# Patient Record
Sex: Male | Born: 1945
Health system: Southern US, Community
[De-identification: ages and names within clinical notes are randomized; demographics above are authoritative.]

## PROBLEM LIST (undated history)

## (undated) DIAGNOSIS — G4700 Insomnia, unspecified: Secondary | ICD-10-CM

## (undated) DIAGNOSIS — I700 Atherosclerosis of aorta: Secondary | ICD-10-CM

## (undated) DIAGNOSIS — I5189 Other ill-defined heart diseases: Secondary | ICD-10-CM

## (undated) DIAGNOSIS — I10 Essential (primary) hypertension: Secondary | ICD-10-CM

## (undated) DIAGNOSIS — R9431 Abnormal electrocardiogram [ECG] [EKG]: Secondary | ICD-10-CM

## (undated) DIAGNOSIS — K219 Gastro-esophageal reflux disease without esophagitis: Secondary | ICD-10-CM

## (undated) DIAGNOSIS — Z8719 Personal history of other diseases of the digestive system: Secondary | ICD-10-CM

## (undated) DIAGNOSIS — R931 Abnormal findings on diagnostic imaging of heart and coronary circulation: Secondary | ICD-10-CM

## (undated) DIAGNOSIS — M459 Ankylosing spondylitis of unspecified sites in spine: Secondary | ICD-10-CM

## (undated) DIAGNOSIS — M109 Gout, unspecified: Secondary | ICD-10-CM

## (undated) DIAGNOSIS — E7800 Pure hypercholesterolemia, unspecified: Secondary | ICD-10-CM

## (undated) DIAGNOSIS — I319 Disease of pericardium, unspecified: Secondary | ICD-10-CM

## (undated) DIAGNOSIS — Z8589 Personal history of malignant neoplasm of other organs and systems: Secondary | ICD-10-CM

## (undated) HISTORY — DX: Personal history of malignant neoplasm of other organs and systems: Z85.89

## (undated) HISTORY — DX: Atherosclerosis of aorta: I70.0

## (undated) HISTORY — DX: Insomnia, unspecified: G47.00

## (undated) HISTORY — DX: Other ill-defined heart diseases: I51.89

## (undated) HISTORY — DX: Ankylosing spondylitis of unspecified sites in spine: M45.9

## (undated) HISTORY — DX: Disease of pericardium, unspecified: I31.9

## (undated) HISTORY — DX: Personal history of other diseases of the digestive system: Z87.19

## (undated) HISTORY — DX: Pure hypercholesterolemia, unspecified: E78.00

## (undated) HISTORY — DX: Abnormal findings on diagnostic imaging of heart and coronary circulation: R93.1

## (undated) HISTORY — DX: Essential (primary) hypertension: I10

## (undated) HISTORY — DX: Gout, unspecified: M10.9

## (undated) HISTORY — DX: Gastro-esophageal reflux disease without esophagitis: K21.9

## (undated) HISTORY — DX: Abnormal electrocardiogram (ECG) (EKG): R94.31

---

## 1998-11-04 ENCOUNTER — Encounter: Payer: Self-pay | Admitting: Family Medicine

## 1998-11-04 ENCOUNTER — Ambulatory Visit (HOSPITAL_COMMUNITY): Admission: RE | Admit: 1998-11-04 | Discharge: 1998-11-04 | Payer: Self-pay | Admitting: Family Medicine

## 2000-04-15 ENCOUNTER — Encounter: Payer: Self-pay | Admitting: Family Medicine

## 2000-04-15 ENCOUNTER — Ambulatory Visit (HOSPITAL_COMMUNITY): Admission: RE | Admit: 2000-04-15 | Discharge: 2000-04-15 | Payer: Self-pay | Admitting: Family Medicine

## 2001-04-10 ENCOUNTER — Emergency Department (HOSPITAL_COMMUNITY): Admission: EM | Admit: 2001-04-10 | Discharge: 2001-04-10 | Payer: Self-pay

## 2001-09-12 ENCOUNTER — Encounter: Payer: Self-pay | Admitting: Family Medicine

## 2001-09-12 ENCOUNTER — Ambulatory Visit (HOSPITAL_COMMUNITY): Admission: RE | Admit: 2001-09-12 | Discharge: 2001-09-12 | Payer: Self-pay | Admitting: Family Medicine

## 2013-09-29 ENCOUNTER — Encounter: Payer: Self-pay | Admitting: General Surgery

## 2013-09-29 DIAGNOSIS — I10 Essential (primary) hypertension: Secondary | ICD-10-CM | POA: Insufficient documentation

## 2013-09-29 DIAGNOSIS — Z8679 Personal history of other diseases of the circulatory system: Secondary | ICD-10-CM | POA: Insufficient documentation

## 2013-09-29 DIAGNOSIS — I309 Acute pericarditis, unspecified: Secondary | ICD-10-CM

## 2013-10-02 ENCOUNTER — Ambulatory Visit (INDEPENDENT_AMBULATORY_CARE_PROVIDER_SITE_OTHER): Payer: BC Managed Care – PPO | Admitting: Cardiology

## 2013-10-02 ENCOUNTER — Encounter: Payer: Self-pay | Admitting: Cardiology

## 2013-10-02 ENCOUNTER — Encounter (INDEPENDENT_AMBULATORY_CARE_PROVIDER_SITE_OTHER): Payer: Self-pay

## 2013-10-02 VITALS — BP 140/89 | HR 79 | Ht 69.5 in | Wt 204.0 lb

## 2013-10-02 DIAGNOSIS — I10 Essential (primary) hypertension: Secondary | ICD-10-CM

## 2013-10-02 DIAGNOSIS — I309 Acute pericarditis, unspecified: Secondary | ICD-10-CM

## 2013-10-02 NOTE — Progress Notes (Signed)
  [address] [place], [place]  [address] Phone: [phone] Fax:  [phone]  Date:  10/02/2013   ID:  Jacob Mcclain, DOB 09/07/1946, MRN [id number]  PCP:  Sissy Hoff, MD  Cardiologist:  Armanda Magic, MD     History of Present Illness: Jacob Mcclain is a 67 y.o. male with a history of Pericarditis and HTN who presents today for followup.  He denies any chest pain, SOB, DOE, LE edema, dizziness, palpitations or syncope.   Wt Readings from Last 3 Encounters:  10/02/13 204 lb (92.534 kg)  09/29/13 193 lb 9.6 oz (87.816 kg)     Past Medical History  Diagnosis Date  . Ankylosing spondylitis   . Hypertension   . Hypercholesterolemia   . GERD (gastroesophageal reflux disease)   . Insomnia   . History of squamous cell carcinoma     Followed by Dr Terri Piedra  . History of hemorrhoids   . Gout     Last flare 7/09  . Pericarditis     Current Outpatient Prescriptions  Medication Sig Dispense Refill  . amLODipine (NORVASC) 5 MG tablet Take 5 mg by mouth daily.      Marland Kitchen aspirin 81 MG tablet Take 81 mg by mouth daily.      Marland Kitchen atorvastatin (LIPITOR) 20 MG tablet Take 20 mg by mouth daily.      . multivitamin (ONE-A-DAY MEN'S) TABS tablet Take 1 tablet by mouth daily.      . nebivolol (BYSTOLIC) 5 MG tablet Take 5 mg by mouth daily.      . Omega-3 Fatty Acids (FISH OIL) 1000 MG CAPS Take by mouth.      Marland Kitchen omeprazole (PRILOSEC) 20 MG capsule Take 20 mg by mouth daily.       No current facility-administered medications for this visit.    Allergies:   No Known Allergies  Social History:  The patient  reports that he has quit smoking. His smoking use included Cigarettes. He smoked 0.00 packs per day. He has never used smokeless tobacco. He reports that he drinks alcohol. He reports that he does not use illicit drugs.   Family History:  The patient's family history is not on file.   ROS:  Please see the history of present illness.      All other systems reviewed and  negative.   PHYSICAL EXAM: VS:  BP 140/89  Pulse 79  Ht 5' 9.5" (1.765 m)  Wt 204 lb (92.534 kg)  BMI 29.70 kg/m2 Well nourished, well developed, in no acute distress HEENT: normal Neck: no JVD Cardiac:  normal S1, S2; RRR; no murmur Lungs:  clear to auscultation bilaterally, no wheezing, rhonchi or rales Abd: soft, nontender, no hepatomegaly Ext: no edema Skin: warm and dry Neuro:  CNs 2-12 intact, no focal abnormalities noted  EKG:  NSR with no ST changes     ASSESSMENT AND PLAN:  1.  History of pericarditis with no reoccurence 2.  HTN with mildly elevated BP on exam but he was rushing to get here and his BP at home shows 130/29mmHg    - continue amlodipine/bystolic  Followup with me in 1 year  Signed, Armanda Magic, MD 10/02/2013 9:57 AM

## 2013-10-02 NOTE — Patient Instructions (Signed)
Your physician recommends that you continue on your current medications as directed. Please refer to the Current Medication list given to you today.  Your physician wants you to follow-up in: 1 Year with Dr Turner You will receive a reminder letter in the mail two months in advance. If you don't receive a letter, please call our office to schedule the follow-up appointment.  

## 2014-08-02 ENCOUNTER — Encounter: Payer: Self-pay | Admitting: Cardiology

## 2014-10-04 ENCOUNTER — Encounter: Payer: Self-pay | Admitting: Cardiology

## 2014-10-04 ENCOUNTER — Ambulatory Visit (INDEPENDENT_AMBULATORY_CARE_PROVIDER_SITE_OTHER): Payer: BC Managed Care – PPO | Admitting: Cardiology

## 2014-10-04 VITALS — BP 148/90 | HR 92 | Ht 71.0 in | Wt 204.4 lb

## 2014-10-04 DIAGNOSIS — I10 Essential (primary) hypertension: Secondary | ICD-10-CM

## 2014-10-04 DIAGNOSIS — Z8679 Personal history of other diseases of the circulatory system: Secondary | ICD-10-CM

## 2014-10-04 NOTE — Progress Notes (Signed)
  [address], [place], [place]  [address] Phone: [phone] Fax:  [phone]  Date:  10/04/2014   ID:  Jacob Mcclain, DOB 03-Dec-1945, MRN [id number]  PCP:  Gara Kroner, MD  Cardiologist:  Fransico Him, MD    History of Present Illness: Jacob Mcclain is a 68 y.o. male with a history of Pericarditis and HTN who presents today for followup. He denies any chest pain, SOB, DOE, LE edema, dizziness, palpitations or syncope.  He works outside for exercise.     Wt Readings from Last 3 Encounters:  10/04/14 204 lb 6.4 oz (92.715 kg)  10/02/13 204 lb (92.534 kg)  09/29/13 193 lb 9.6 oz (87.816 kg)     Past Medical History  Diagnosis Date  . Ankylosing spondylitis   . Hypertension   . Hypercholesterolemia   . GERD (gastroesophageal reflux disease)   . Insomnia   . History of squamous cell carcinoma     Followed by Dr Allyson Sabal  . History of hemorrhoids   . Gout     Last flare 7/09  . Pericarditis   . Diastolic dysfunction     Current Outpatient Prescriptions  Medication Sig Dispense Refill  . amLODipine (NORVASC) 5 MG tablet Take 5 mg by mouth daily.    Marland Kitchen aspirin 81 MG tablet Take 81 mg by mouth daily.    Marland Kitchen atorvastatin (LIPITOR) 20 MG tablet Take 20 mg by mouth daily.    . multivitamin (ONE-A-DAY MEN'S) TABS tablet Take 1 tablet by mouth daily.    . nebivolol (BYSTOLIC) 5 MG tablet Take 5 mg by mouth daily.    . Omega-3 Fatty Acids (FISH OIL) 1000 MG CAPS Take by mouth.     No current facility-administered medications for this visit.    Allergies:   No Known Allergies  Social History:  The patient  reports that he has quit smoking. His smoking use included Cigarettes. He smoked 0.00 packs per day. He has never used smokeless tobacco. He reports that he drinks about 4.2 oz of alcohol per week. He reports that he does not use illicit drugs.   Family History:  The patient's family history includes Lung cancer in his father.   ROS:  Please see the history  of present illness.      All other systems reviewed and negative.   PHYSICAL EXAM: VS:  BP 148/90 mmHg  Pulse 92  Ht 5\' 11"  (1.803 m)  Wt 204 lb 6.4 oz (92.715 kg)  BMI 28.52 kg/m2 Well nourished, well developed, in no acute distress HEENT: normal Neck: no JVD Cardiac:  normal S1, S2; RRR; no murmur Lungs:  clear to auscultation bilaterally, no wheezing, rhonchi or rales Abd: soft, nontender, no hepatomegaly Ext: trace edema Skin: warm and dry Neuro:  CNs 2-12 intact, no focal abnormalities noted  EKG:     NSR with no ST changes  ASSESSMENT AND PLAN:  1. History of pericarditis with no reoccurence 2. HTN with mildly elevated BP on exam but he was rushing to get here from his car and his BP at home shows 130/101mmHg  - continue amlodipine/bystolic  Followup with me in 1 year  Signed, Fransico Him, MD [facility] 10/04/2014 8:44 AM

## 2014-10-04 NOTE — Patient Instructions (Signed)
Your physician recommends that you continue on your current medications as directed. Please refer to the Current Medication list given to you today.  Your physician wants you to follow-up in: 1 year with Dr. Turner. You will receive a reminder letter in the mail two months in advance. If you don't receive a letter, please call our office to schedule the follow-up appointment.  

## 2014-10-12 ENCOUNTER — Other Ambulatory Visit: Payer: Self-pay | Admitting: Family Medicine

## 2014-10-12 DIAGNOSIS — Z87891 Personal history of nicotine dependence: Secondary | ICD-10-CM

## 2014-11-12 ENCOUNTER — Ambulatory Visit
Admission: RE | Admit: 2014-11-12 | Discharge: 2014-11-12 | Disposition: A | Discharge status: Home / Self Care | Payer: BC Managed Care – PPO | Source: Ambulatory Visit | Attending: Family Medicine | Admitting: Family Medicine

## 2014-11-12 DIAGNOSIS — Z87891 Personal history of nicotine dependence: Secondary | ICD-10-CM

## 2015-07-10 ENCOUNTER — Encounter: Payer: Self-pay | Admitting: Cardiology

## 2015-09-09 IMAGING — US US AORTA
1 series · 12 of 12 positions shown · non-contrast
Comparison: None.

CLINICAL DATA: Aneurysm screening, hypertension, previous tobacco
abuse

EXAM:
ULTRASOUND OF ABDOMINAL AORTA
TECHNIQUE: Ultrasound examination of the abdominal aorta was performed to
evaluate for abdominal aortic aneurysm.

[Series 1: us aorta · 0.29mm/px · 12 of 12 slices shown]
[im 1/12]
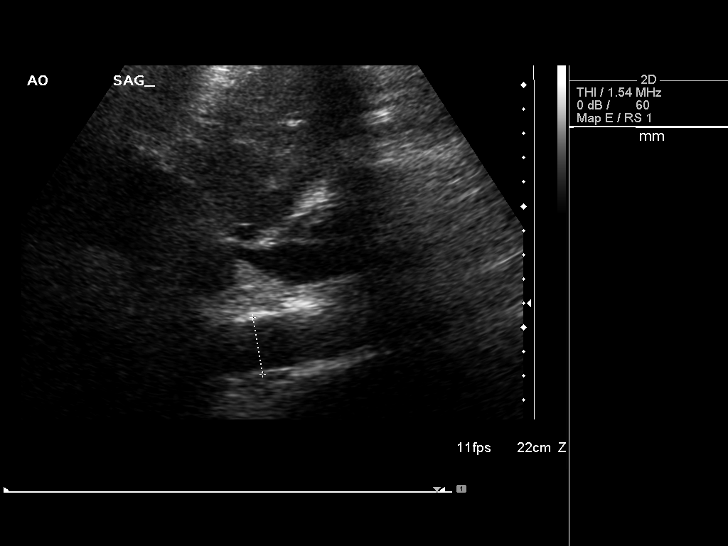
[im 2/12]
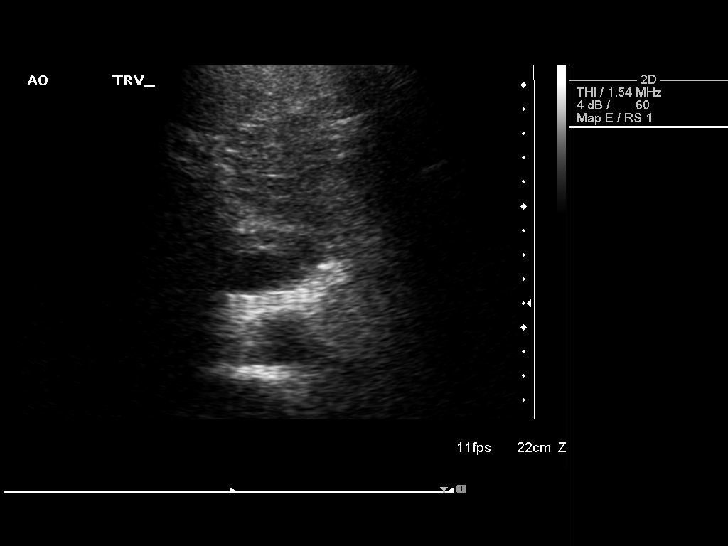
[im 3/12]
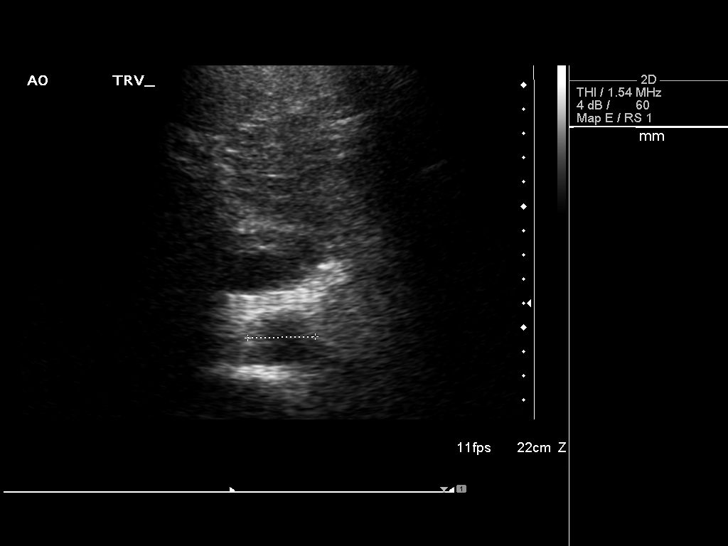
[im 4/12]
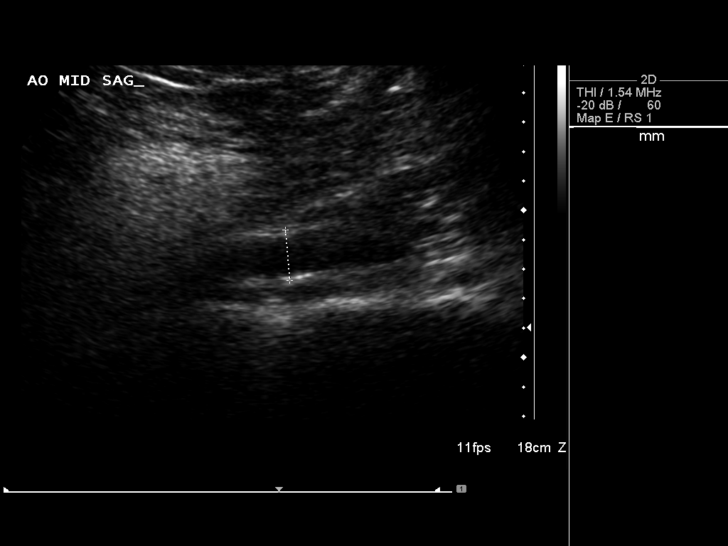
[im 5/12]
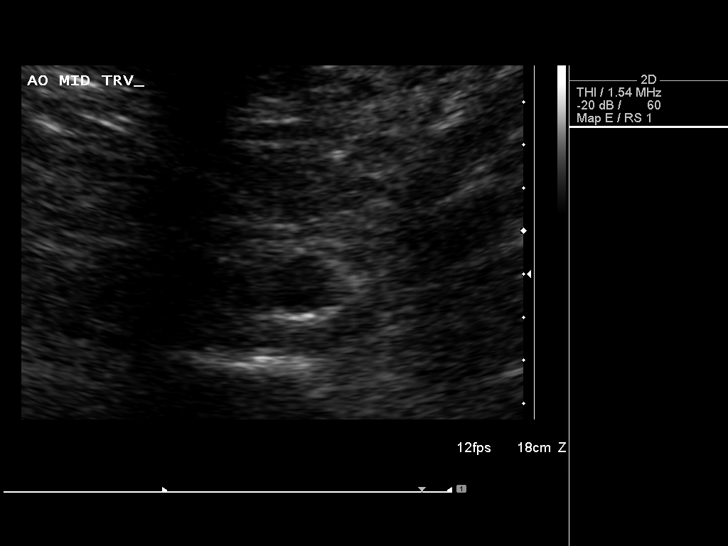
[im 6/12]
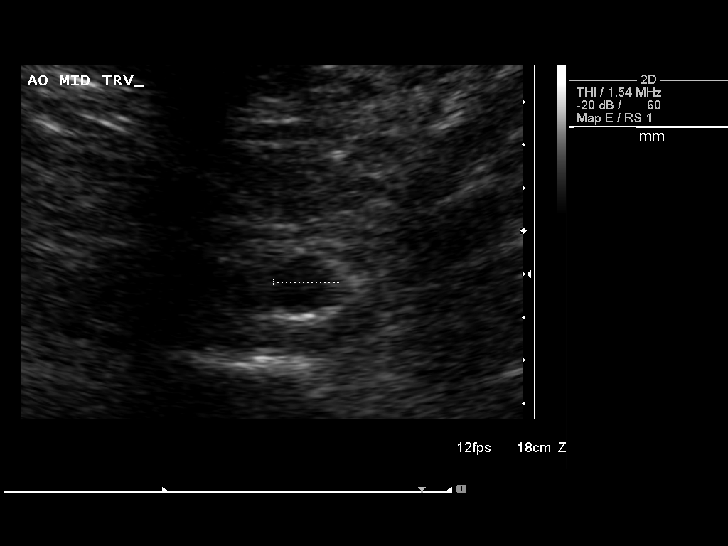
[im 7/12]
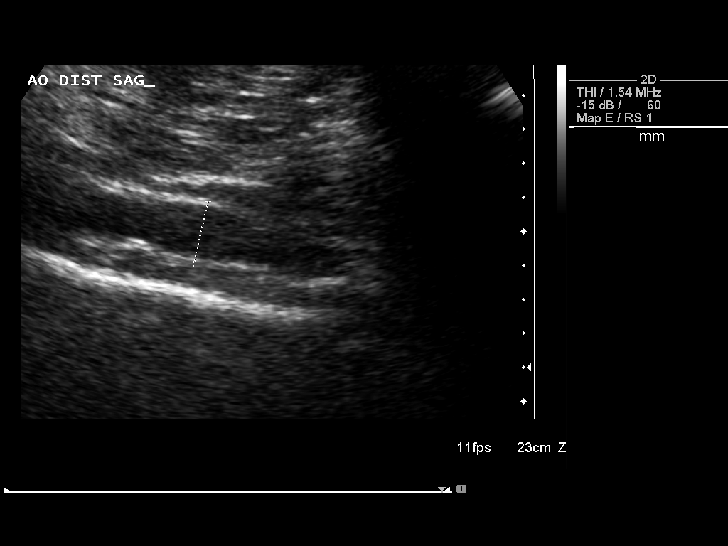
[im 8/12]
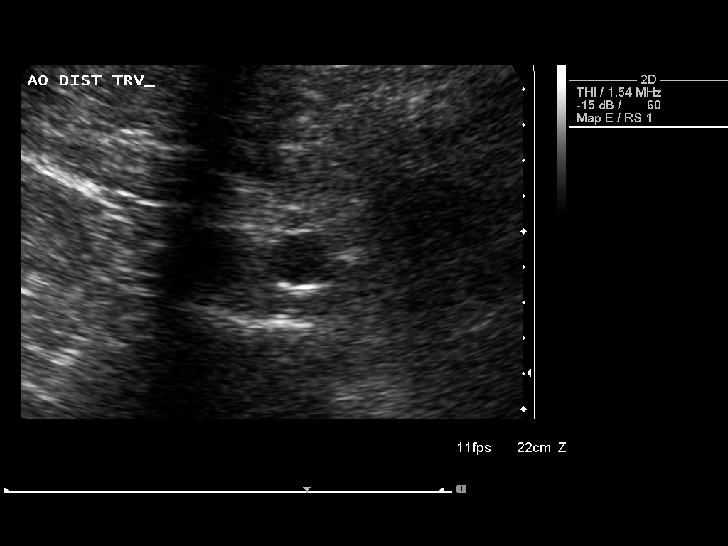
[im 9/12]
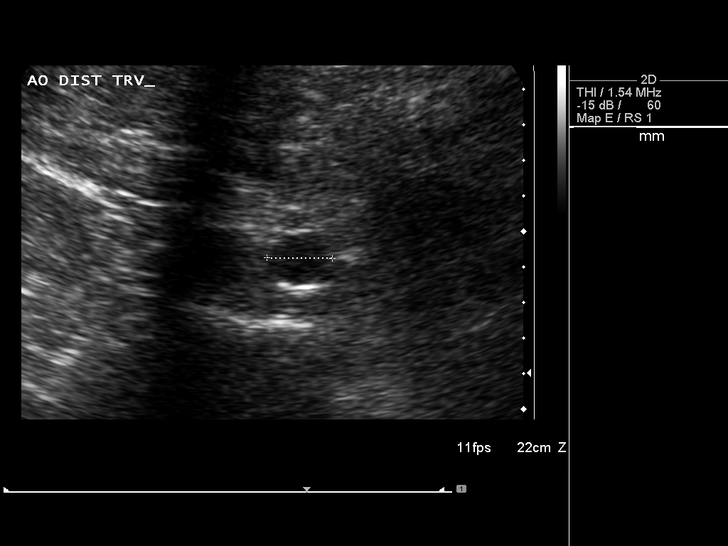
[im 10/12]
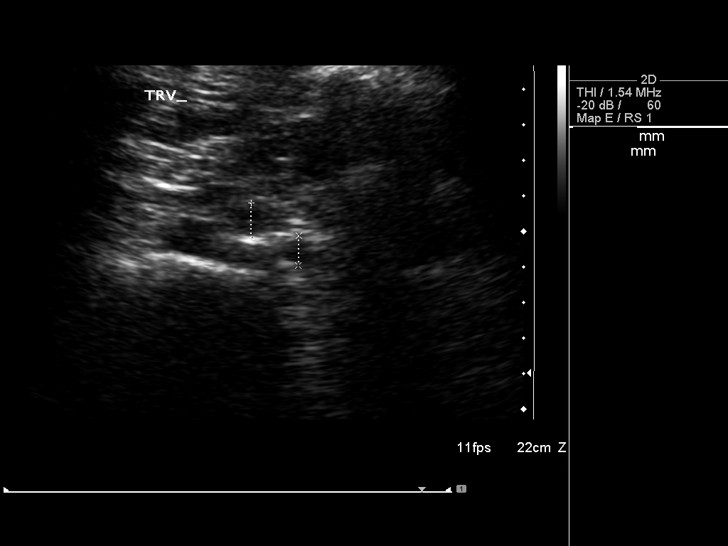
[im 11/12]
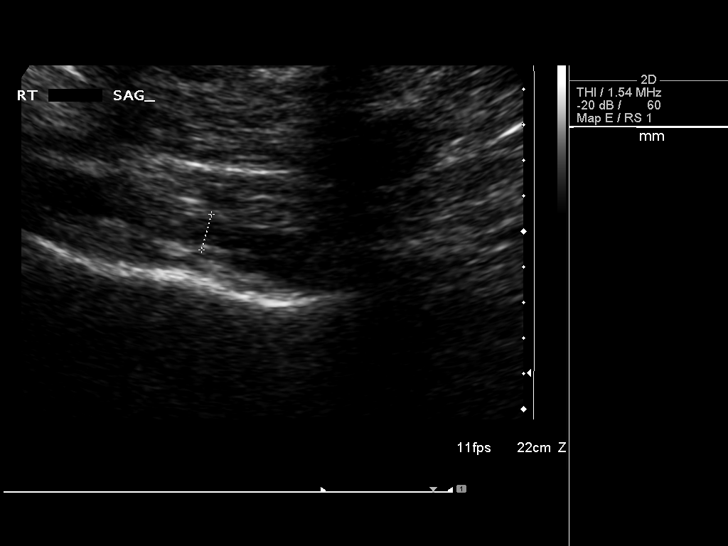
[im 12/12]
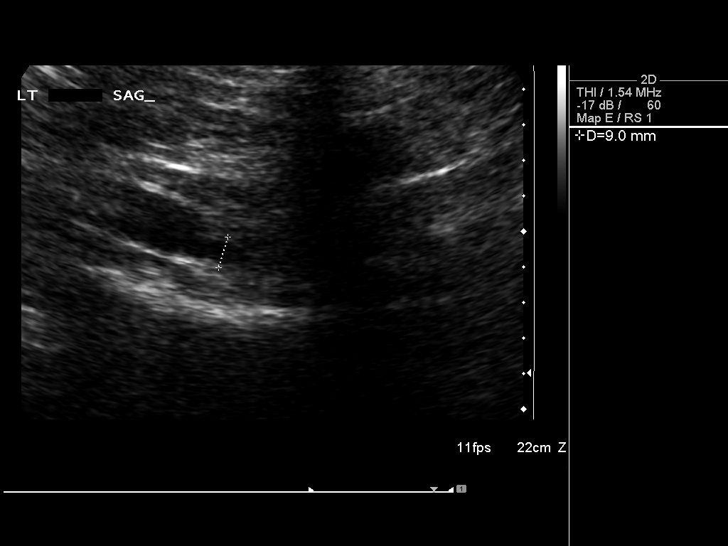

[12 of 12 positions shown; findings below may reference images not displayed]

FINDINGS: Abdominal Aorta

No aneurysm identified.  The proximal aorta is ectatic.

Maximum AP

Diameter:  2.4 cm, proximal segment

Maximum TRV

Diameter: 2.8 cm, proximal segment

Right common iliac artery measures 1 cm diameter, left 9 mm.
IMPRESSION: 1. Negative for abdominal aortic aneurysm. Ectatic abdominal aorta
at risk for aneurysm development. Recommend follow up by US in 5
years. This recommendation follows ACR consensus guidelines: White
Paper of the ACR Incidental Findings Committee II on Vascular
Findings. [HOSPITAL] 8481; [DATE].

## 2015-10-04 ENCOUNTER — Encounter: Payer: Self-pay | Admitting: Cardiology

## 2015-10-04 ENCOUNTER — Ambulatory Visit (INDEPENDENT_AMBULATORY_CARE_PROVIDER_SITE_OTHER): Payer: BLUE CROSS/BLUE SHIELD | Admitting: Cardiology

## 2015-10-04 VITALS — BP 130/88 | HR 92 | Ht 71.0 in | Wt 212.4 lb

## 2015-10-04 DIAGNOSIS — I10 Essential (primary) hypertension: Secondary | ICD-10-CM

## 2015-10-04 DIAGNOSIS — Z8679 Personal history of other diseases of the circulatory system: Secondary | ICD-10-CM | POA: Diagnosis not present

## 2015-10-04 NOTE — Progress Notes (Signed)
Cardiology Office Note   Date:  10/04/2015   ID:  Jacob Mcclain, DOB 12/01/1945, MRN [id number]  PCP:  Gara Kroner, MD    Chief Complaint  Patient presents with  . Follow-up    PERICARDITIS, PT HAS NO COMPLAINTS      History of Present Illness: Jacob Mcclain is a 69 y.o. male with a history of Pericarditis and HTN who presents today for followup. He denies any chest pain, SOB, DOE, LE edema, dizziness, palpitations or syncope. He works outside for exercise    Past Medical History  Diagnosis Date  . Ankylosing spondylitis ([place])   . Hypertension   . Hypercholesterolemia   . GERD (gastroesophageal reflux disease)   . Insomnia   . History of squamous cell carcinoma     Followed by Dr Allyson Sabal  . History of hemorrhoids   . Gout     Last flare 7/09  . Pericarditis   . Diastolic dysfunction     No past surgical history on file.   Current Outpatient Prescriptions  Medication Sig Dispense Refill  . amLODipine (NORVASC) 5 MG tablet Take 5 mg by mouth daily.    Marland Kitchen aspirin 81 MG tablet Take 81 mg by mouth daily.    Marland Kitchen atorvastatin (LIPITOR) 20 MG tablet Take 20 mg by mouth daily.    . multivitamin (ONE-A-DAY MEN'S) TABS tablet Take 1 tablet by mouth daily.    . nebivolol (BYSTOLIC) 5 MG tablet Take 5 mg by mouth daily.    . Omega-3 Fatty Acids (FISH OIL) 1000 MG CAPS Take by mouth.     No current facility-administered medications for this visit.    Allergies:   Review of patient's allergies indicates no known allergies.    Social History:  The patient  reports that he has quit smoking. His smoking use included Cigarettes. He has never used smokeless tobacco. He reports that he drinks about 4.2 oz of alcohol per week. He reports that he does not use illicit drugs.   Family History:  The patient's family history includes Lung cancer in his father.    ROS:  Please see the history of present illness.   Otherwise, review of systems are positive for  none.   All other systems are reviewed and negative.    PHYSICAL EXAM: VS:  BP 130/88 mmHg  Pulse 92  Ht 5\' 11"  (1.803 m)  Wt 212 lb 6.4 oz (96.344 kg)  BMI 29.64 kg/m2  SpO2 96% , BMI Body mass index is 29.64 kg/(m^2). GEN: Well nourished, well developed, in no acute distress HEENT: normal Neck: no JVD, carotid bruits, or masses Cardiac: RRR; no murmurs, rubs, or gallops,no edema  Respiratory:  clear to auscultation bilaterally, normal work of breathing GI: soft, nontender, nondistended, + BS MS: no deformity or atrophy Skin: warm and dry, no rash Neuro:  Strength and sensation are intact Psych: euthymic mood, full affect   EKG:  EKG was ordered today showing NSR     Recent Labs: No results found for requested labs within last 365 days.    Lipid Panel No results found for: CHOL, TRIG, HDL, CHOLHDL, VLDL, LDLCALC, LDLDIRECT    Wt Readings from Last 3 Encounters:  10/04/15 212 lb 6.4 oz (96.344 kg)  10/04/14 204 lb 6.4 oz (92.715 kg)  10/02/13 204 lb (92.534 kg)     ASSESSMENT AND PLAN:  1. History of pericarditis  with no reoccurence 2. HTN - well controlled  - continue amlodipine/bystolic  Current medicines are reviewed at length with the patient today.  The patient does not have concerns regarding medicines.  The following changes have been made:  no change  Labs/ tests ordered today: See above Assessment and Plan No orders of the defined types were placed in this encounter.     Disposition:   FU with me in 1 year  Signed, Sueanne Margarita, MD  10/04/2015 8:27 AM    [facility] [address], [place], [place]  [address] Phone: [phone]; Fax: [phone]

## 2015-10-04 NOTE — Patient Instructions (Signed)

## 2015-12-02 DIAGNOSIS — M109 Gout, unspecified: Secondary | ICD-10-CM | POA: Diagnosis not present

## 2015-12-02 DIAGNOSIS — K219 Gastro-esophageal reflux disease without esophagitis: Secondary | ICD-10-CM | POA: Diagnosis not present

## 2015-12-02 DIAGNOSIS — E782 Mixed hyperlipidemia: Secondary | ICD-10-CM | POA: Diagnosis not present

## 2015-12-02 DIAGNOSIS — I10 Essential (primary) hypertension: Secondary | ICD-10-CM | POA: Diagnosis not present

## 2015-12-02 DIAGNOSIS — Z23 Encounter for immunization: Secondary | ICD-10-CM | POA: Diagnosis not present

## 2016-01-21 DIAGNOSIS — H59032 Cystoid macular edema following cataract surgery, left eye: Secondary | ICD-10-CM | POA: Diagnosis not present

## 2016-01-21 DIAGNOSIS — H2513 Age-related nuclear cataract, bilateral: Secondary | ICD-10-CM | POA: Diagnosis not present

## 2016-01-21 DIAGNOSIS — H5210 Myopia, unspecified eye: Secondary | ICD-10-CM | POA: Diagnosis not present

## 2016-01-21 DIAGNOSIS — H524 Presbyopia: Secondary | ICD-10-CM | POA: Diagnosis not present

## 2016-03-19 DIAGNOSIS — Z0100 Encounter for examination of eyes and vision without abnormal findings: Secondary | ICD-10-CM | POA: Diagnosis not present

## 2016-04-07 DIAGNOSIS — L820 Inflamed seborrheic keratosis: Secondary | ICD-10-CM | POA: Diagnosis not present

## 2016-04-07 DIAGNOSIS — D1801 Hemangioma of skin and subcutaneous tissue: Secondary | ICD-10-CM | POA: Diagnosis not present

## 2016-04-07 DIAGNOSIS — L814 Other melanin hyperpigmentation: Secondary | ICD-10-CM | POA: Diagnosis not present

## 2016-06-08 DIAGNOSIS — I10 Essential (primary) hypertension: Secondary | ICD-10-CM | POA: Diagnosis not present

## 2016-06-08 DIAGNOSIS — E782 Mixed hyperlipidemia: Secondary | ICD-10-CM | POA: Diagnosis not present

## 2016-06-08 DIAGNOSIS — Z1389 Encounter for screening for other disorder: Secondary | ICD-10-CM | POA: Diagnosis not present

## 2016-06-08 DIAGNOSIS — K219 Gastro-esophageal reflux disease without esophagitis: Secondary | ICD-10-CM | POA: Diagnosis not present

## 2016-06-08 DIAGNOSIS — M109 Gout, unspecified: Secondary | ICD-10-CM | POA: Diagnosis not present

## 2016-08-19 DIAGNOSIS — Z23 Encounter for immunization: Secondary | ICD-10-CM | POA: Diagnosis not present

## 2016-11-03 DIAGNOSIS — L821 Other seborrheic keratosis: Secondary | ICD-10-CM | POA: Diagnosis not present

## 2016-11-03 DIAGNOSIS — D235 Other benign neoplasm of skin of trunk: Secondary | ICD-10-CM | POA: Diagnosis not present

## 2016-11-03 DIAGNOSIS — L814 Other melanin hyperpigmentation: Secondary | ICD-10-CM | POA: Diagnosis not present

## 2016-11-19 ENCOUNTER — Ambulatory Visit (INDEPENDENT_AMBULATORY_CARE_PROVIDER_SITE_OTHER): Payer: Medicare HMO | Admitting: Cardiology

## 2016-11-19 ENCOUNTER — Encounter: Payer: Self-pay | Admitting: Cardiology

## 2016-11-19 ENCOUNTER — Encounter (INDEPENDENT_AMBULATORY_CARE_PROVIDER_SITE_OTHER): Payer: Self-pay

## 2016-11-19 VITALS — BP 180/92 | HR 90 | Ht 71.0 in | Wt 209.1 lb

## 2016-11-19 DIAGNOSIS — I10 Essential (primary) hypertension: Secondary | ICD-10-CM | POA: Diagnosis not present

## 2016-11-19 DIAGNOSIS — Z8679 Personal history of other diseases of the circulatory system: Secondary | ICD-10-CM

## 2016-11-19 DIAGNOSIS — R9431 Abnormal electrocardiogram [ECG] [EKG]: Secondary | ICD-10-CM | POA: Insufficient documentation

## 2016-11-19 HISTORY — DX: Abnormal electrocardiogram (ECG) (EKG): R94.31

## 2016-11-19 MED ORDER — AMLODIPINE BESYLATE 10 MG PO TABS: 10.0000 mg | ORAL_TABLET | Freq: Every day | ORAL | 3 refills | Status: DC

## 2016-11-19 NOTE — Patient Instructions (Signed)
Medication Instructions:  1) INCREASE AMLODIPINE to 10 mg daily  Labwork: None  Testing/Procedures: None  Follow-Up: Your physician recommends that you schedule a follow-up appointment in [place] in the HTN CLINIC.  Your physician wants you to follow-up in: 1 year with Dr. Radford Pax. You will receive a reminder letter in the mail two months in advance. If you don't receive a letter, please call our office to schedule the follow-up appointment.   Any Other Special Instructions Will Be Listed Below (If Applicable).     If you need a refill on your cardiac medications before your next appointment, please call your pharmacy.

## 2016-11-19 NOTE — Progress Notes (Signed)
Cardiology Office Note    Date:  11/19/2016   ID:  Jacob Mcclain, DOB 08-15-1946, MRN [id number]  PCP:  Gara Kroner, MD  Cardiologist:  Fransico Him, MD   Chief Complaint  Patient presents with  . Follow-up    pericarditis, HTN    History of Present Illness:  Jacob Mcclain is a 71 y.o. male with a history of Pericarditis and HTN who presents today for followup. He denies any chest pain or pressure, SOB, DOE (except walking up a steep driveway but otherwise none), LE edema, dizziness, palpitations or syncope. He denies any claudication.  He works outside for exercise   Past Medical History:  Diagnosis Date  . Abnormal EKG 11/19/2016   LVH with repolarization abnormality  . Ankylosing spondylitis ([place])   . Diastolic dysfunction   . GERD (gastroesophageal reflux disease)   . Gout    Last flare 7/09  . History of hemorrhoids   . History of squamous cell carcinoma    Followed by Dr Allyson Sabal  . Hypercholesterolemia   . Hypertension   . Insomnia   . Pericarditis     History reviewed. No pertinent surgical history.  Current Medications: Outpatient Medications Prior to Visit  Medication Sig Dispense Refill  . amLODipine (NORVASC) 5 MG tablet Take 5 mg by mouth daily.    Marland Kitchen aspirin 81 MG tablet Take 81 mg by mouth daily.    Marland Kitchen atorvastatin (LIPITOR) 20 MG tablet Take 20 mg by mouth daily.    . multivitamin (ONE-A-DAY MEN'S) TABS tablet Take 1 tablet by mouth daily.    . nebivolol (BYSTOLIC) 5 MG tablet Take 5 mg by mouth daily.    . Omega-3 Fatty Acids (FISH OIL) 1000 MG CAPS Take 1 capsule by mouth daily.      No facility-administered medications prior to visit.      Allergies:   Patient has no known allergies.   Social History   Social History  . Marital status: Married    Spouse name: N/A  . Number of children: N/A  . Years of education: N/A   Social History Main Topics  . Smoking status: Former Smoker    Types: Cigarettes  . Smokeless tobacco: Never Used    . Alcohol use 4.2 oz/week    7 Glasses of wine per week     Comment: 2-3 beers weekly  . Drug use: No  . Sexual activity: Not Asked   Other Topics Concern  . None   Social History Narrative  . None     Family History:  The patient's family history includes Lung cancer in his father.   ROS:   Please see the history of present illness.    ROS All other systems reviewed and are negative.  No flowsheet data found.     PHYSICAL EXAM:   VS:  BP (!) 180/92   Pulse 90   Ht 5\' 11"  (1.803 m)   Wt 209 lb 1.9 oz (94.9 kg)   SpO2 97%   BMI 29.17 kg/m    GEN: Well nourished, well developed, in no acute distress  HEENT: normal  Neck: no JVD, carotid bruits, or masses Cardiac: RRR; no murmurs, rubs, or gallops,no edema.  Intact distal pulses bilaterally.  Respiratory:  clear to auscultation bilaterally, normal work of breathing GI: soft, nontender, nondistended, + BS MS: no deformity or atrophy  Skin: warm and dry, no rash Neuro:  Alert and Oriented x 3, Strength and sensation are intact Psych:  euthymic mood, full affect  Wt Readings from Last 3 Encounters:  11/19/16 209 lb 1.9 oz (94.9 kg)  10/04/15 212 lb 6.4 oz (96.3 kg)  10/04/14 204 lb 6.4 oz (92.7 kg)      Studies/Labs Reviewed:   EKG:  EKG is ordered today and showed NSR at 78bpm with LVH with repolarization abnormality  Recent Labs: No results found for requested labs within last 8760 hours.   Lipid Panel No results found for: CHOL, TRIG, HDL, CHOLHDL, VLDL, LDLCALC, LDLDIRECT  Additional studies/ records that were reviewed today include:  none    ASSESSMENT:    1. H/O pericarditis   2. Essential hypertension, benign   3. Abnormal EKG      PLAN:  In order of problems listed above:  1. H/O pericarditis with no reoccurrence.  He has not had any chest pain. 2.   HTN - BP poorly controlled today.  Continue BB.  I will increase amlodipine to 10mg  daily and followup with HTN clinic in 1 week.  I  encouraged him to get back into a routine exercise program of aerobic exercise 30-45 minutes 5 times weekly.  3.   Abnormal EKG - EKG has changed from prior with LVH and repolarization abnormallity likely due to poorly controlled HTN.  He has not had any anginal symptoms. Needs aggressive BP control.    Medication Adjustments/Labs and Tests Ordered: Current medicines are reviewed at length with the patient today.  Concerns regarding medicines are outlined above.  Medication changes, Labs and Tests ordered today are listed in the Patient Instructions below.  There are no Patient Instructions on file for this visit.   Signed, Fransico Him, MD  11/19/2016 10:15 AM    [address], [place], [place]  [address] Phone: [phone]; Fax: [phone]

## 2016-11-26 ENCOUNTER — Ambulatory Visit (INDEPENDENT_AMBULATORY_CARE_PROVIDER_SITE_OTHER): Payer: Medicare HMO | Admitting: Pharmacist

## 2016-11-26 VITALS — BP 142/84 | HR 76 | Wt 209.5 lb

## 2016-11-26 DIAGNOSIS — I10 Essential (primary) hypertension: Secondary | ICD-10-CM | POA: Diagnosis not present

## 2016-11-26 NOTE — Patient Instructions (Signed)
Return for a follow up appointment in 4-6 weeks  Check your blood pressure at home daily (if able) and keep record of the readings.  Take your BP meds as follows: Continue amlodipine 10mg  daily and Bystolic 5mg  daily  Bring all of your meds, your BP cuff and your record of home blood pressures to your next appointment.  Exercise as you're able, try to walk approximately 30 minutes per day.  Keep salt intake to a minimum, especially watch canned and prepared boxed foods.  Eat more fresh fruits and vegetables and fewer canned items.  Avoid eating in fast food restaurants.    HOW TO TAKE YOUR BLOOD PRESSURE: . Rest 5 minutes before taking your blood pressure. .  Don't smoke or drink caffeinated beverages for at least 30 minutes before. . Take your blood pressure before (not after) you eat. . Sit comfortably with your back supported and both feet on the floor (don't cross your legs). . Elevate your arm to heart level on a table or a desk. . Use the proper sized cuff. It should fit smoothly and snugly around your bare upper arm. There should be enough room to slip a fingertip under the cuff. The bottom edge of the cuff should be 1 inch above the crease of the elbow. . Ideally, take 3 measurements at one sitting and record the average.

## 2016-11-26 NOTE — Progress Notes (Signed)
Patient ID: Jacob Mcclain                 DOB: 09-12-1946                      MRN: [id number]     HPI: Jacob Mcclain is a 71 y.o. male patient of Dr. Radford Pax with PMH below who presents today for hypertension evaluation. At his most recent visit his pressure was elevated to 180/92 (HR 90) and his amlodipine was increased to 10mg  daily. He was also encouraged to start a regular workout program.   He presents today and states he has started exercising again since his last visit with Dr. Radford Pax. He has done well on the higher dose of amlodipine 10mg . He reports his pressures have come down to 140s/80s mostly and after work outs his pressures are 120s/70s.    Cardiac Hx: HTN, h/o pericarditis  Current HTN meds:  Bystolic 5mg  daily Amlodipine 10mg  daily  Previously tried:  An unknown beta blocker that was not as potent on his blood pressure or HR.   BP goal: <130/80  Family History: His mother had hypertension.   Social History: He quit smoking about 30 years ago. He endorses 1-2 beers or glasses of wine most days of the week.   Diet: He eats most of his meals from home. He prepares them and tries to be mindful of sodium content. He does drink 2 cups of coffee per day and rarely will have a glass of tea.   Exercise: works outside. He has been working out 5 days a week since last week and plans to continue.    Wt Readings from Last 3 Encounters:  11/26/16 209 lb 8 oz (95 kg)  11/19/16 209 lb 1.9 oz (94.9 kg)  10/04/15 212 lb 6.4 oz (96.3 kg)   BP Readings from Last 3 Encounters:  11/26/16 (!) 142/84  11/19/16 (!) 180/92  10/04/15 130/88   Pulse Readings from Last 3 Encounters:  11/26/16 76  11/19/16 90  10/04/15 92    Renal function: CrCl cannot be calculated (No order found.).  Past Medical History:  Diagnosis Date  . Abnormal EKG 11/19/2016   LVH with repolarization abnormality  . Ankylosing spondylitis ([place])   . Diastolic dysfunction   . GERD (gastroesophageal reflux  disease)   . Gout    Last flare 7/09  . History of hemorrhoids   . History of squamous cell carcinoma    Followed by Dr Allyson Sabal  . Hypercholesterolemia   . Hypertension   . Insomnia   . Pericarditis     Current Outpatient Prescriptions on File Prior to Visit  Medication Sig Dispense Refill  . amLODipine (NORVASC) 10 MG tablet Take 1 tablet (10 mg total) by mouth daily. 90 tablet 3  . aspirin 81 MG tablet Take 81 mg by mouth daily.    Marland Kitchen atorvastatin (LIPITOR) 20 MG tablet Take 20 mg by mouth daily.    . multivitamin (ONE-A-DAY MEN'S) TABS tablet Take 1 tablet by mouth daily.    . nebivolol (BYSTOLIC) 5 MG tablet Take 5 mg by mouth daily.    . Omega-3 Fatty Acids (FISH OIL) 1000 MG CAPS Take 1 capsule by mouth daily.      No current facility-administered medications on file prior to visit.     No Known Allergies  Blood pressure (!) 142/84, pulse 76, weight 209 lb 8 oz (95 kg).   Assessment/Plan: Hypertension: BP not at  goal today. Will continue same medications since pressure is improved from visit last week and he has implemented a strict workout plan. Encouraged continued exercise and medication compliance. If pressure still elevated at follow will plan to titrate Bystolic.    Thank you, Lelan Pons. Patterson Hammersmith, [facility]  11/26/2016 9:08 AM

## 2016-12-08 DIAGNOSIS — Z125 Encounter for screening for malignant neoplasm of prostate: Secondary | ICD-10-CM | POA: Diagnosis not present

## 2016-12-08 DIAGNOSIS — E782 Mixed hyperlipidemia: Secondary | ICD-10-CM | POA: Diagnosis not present

## 2016-12-08 DIAGNOSIS — Z1389 Encounter for screening for other disorder: Secondary | ICD-10-CM | POA: Diagnosis not present

## 2016-12-08 DIAGNOSIS — Z1211 Encounter for screening for malignant neoplasm of colon: Secondary | ICD-10-CM | POA: Diagnosis not present

## 2016-12-08 DIAGNOSIS — K219 Gastro-esophageal reflux disease without esophagitis: Secondary | ICD-10-CM | POA: Diagnosis not present

## 2016-12-08 DIAGNOSIS — Z0000 Encounter for general adult medical examination without abnormal findings: Secondary | ICD-10-CM | POA: Diagnosis not present

## 2016-12-08 DIAGNOSIS — M109 Gout, unspecified: Secondary | ICD-10-CM | POA: Diagnosis not present

## 2016-12-08 DIAGNOSIS — I10 Essential (primary) hypertension: Secondary | ICD-10-CM | POA: Diagnosis not present

## 2017-01-06 NOTE — Progress Notes (Signed)
Patient ID: LARNIE HEART                 DOB: [birth year]                      MRN: [id number]     HPI: Jacob Mcclain is a 71 y.o. male patient of Dr. Radford Pax with PMH below who presents today for hypertension follow up. At his last visit in HTN clinic no medication changes were made as he was working on a new exercise program and the patient preferred to wait on medication changes. He reported that his pressure tended to be lower after exercise (still in the 120s/80s).   Patient presents today in a pleasant mood and feeling overall well. He has no complaints of dizziness, lightheadedness, or edema related to the amlodipine dose increase.   Cardiac Hx: HTN, h/o pericarditis  Current HTN meds:  Bystolic 5mg  daily Amlodipine 10mg  daily  Previously tried:  An unknown beta blocker that was not as potent on his blood pressure or HR.   BP goal: <130/80  Family History: His mother had hypertension.   Social History: He quit smoking about 30 years ago. He endorses 1-2 beers or glasses of wine most days of the week.   Diet: He eats most of his meals from home. He prepares them and tries to be mindful of sodium content. He does drink 2 cups of coffee per day and rarely will have a glass of tea.   Exercise: Works outside. He has been working out 5 days a week since last week and plans to continue.   Blood Pressure: 130-low 140s/70-80s, in the 110s ~30 minutes after exercise   Wt Readings from Last 3 Encounters:  11/26/16 209 lb 8 oz (95 kg)  11/19/16 209 lb 1.9 oz (94.9 kg)  10/04/15 212 lb 6.4 oz (96.3 kg)   BP Readings from Last 3 Encounters:  01/07/17 138/80  11/26/16 (!) 142/84  11/19/16 (!) 180/92   Pulse Readings from Last 3 Encounters:  01/07/17 89  11/26/16 76  11/19/16 90    Renal function: CrCl cannot be calculated (No order found.).  Past Medical History:  Diagnosis Date  . Abnormal EKG 11/19/2016   LVH with repolarization abnormality  . Ankylosing spondylitis ([place])    . Diastolic dysfunction   . GERD (gastroesophageal reflux disease)   . Gout    Last flare 7/09  . History of hemorrhoids   . History of squamous cell carcinoma    Followed by Dr Allyson Sabal  . Hypercholesterolemia   . Hypertension   . Insomnia   . Pericarditis     Current Outpatient Prescriptions on File Prior to Visit  Medication Sig Dispense Refill  . amLODipine (NORVASC) 10 MG tablet Take 1 tablet (10 mg total) by mouth daily. 90 tablet 3  . nebivolol (BYSTOLIC) 5 MG tablet Take 5 mg by mouth daily.    Marland Kitchen aspirin 81 MG tablet Take 81 mg by mouth daily.    Marland Kitchen atorvastatin (LIPITOR) 20 MG tablet Take 20 mg by mouth daily.    . multivitamin (ONE-A-DAY MEN'S) TABS tablet Take 1 tablet by mouth daily.    . Omega-3 Fatty Acids (FISH OIL) 1000 MG CAPS Take 1 capsule by mouth daily.      No current facility-administered medications on file prior to visit.     No Known Allergies  Blood pressure 138/80, pulse 89, SpO2 96 %.   Assessment/Plan: Hypertension: Patient's blood  pressure is close to goal of <130/80, and dips to goal after exercise.  1. Continue nebivolol 5 mg and amlodipine 10 mg daily. Patient felt overall well controlled, and we do not want to risk causing hypotension after exercise.  If blood pressure is more elevated at further follow up appointments, nebivolol can be titrated.   Follow up as needed.   Patient was seen with Courtney Heys, PharmD Candidate 2018.   Thank you, Lelan Pons. [place], [place]

## 2017-01-07 ENCOUNTER — Ambulatory Visit (INDEPENDENT_AMBULATORY_CARE_PROVIDER_SITE_OTHER): Payer: Medicare HMO | Admitting: Pharmacist

## 2017-01-07 VITALS — BP 138/80 | HR 89

## 2017-01-07 DIAGNOSIS — I10 Essential (primary) hypertension: Secondary | ICD-10-CM | POA: Diagnosis not present

## 2017-01-07 NOTE — Patient Instructions (Signed)
Return for a a follow up appointment with Dr. Radford Pax as needed.  Your blood pressure today is 138/80   Check your blood pressure at home daily (if able) and keep record of the readings.  Take your BP meds as follows: CONTINUE Bystolic (nebivolol) 5 mg daily and amlodipine 10 mg daily.   Bring all of your meds, your BP cuff and your record of home blood pressures to your next appointment.  Exercise as you're able, try to walk approximately 30 minutes per day.  Keep salt intake to a minimum, especially watch canned and prepared boxed foods.  Eat more fresh fruits and vegetables and fewer canned items.  Avoid eating in fast food restaurants.    HOW TO TAKE YOUR BLOOD PRESSURE: . Rest 5 minutes before taking your blood pressure. .  Don't smoke or drink caffeinated beverages for at least 30 minutes before. . Take your blood pressure before (not after) you eat. . Sit comfortably with your back supported and both feet on the floor (don't cross your legs). . Elevate your arm to heart level on a table or a desk. . Use the proper sized cuff. It should fit smoothly and snugly around your bare upper arm. There should be enough room to slip a fingertip under the cuff. The bottom edge of the cuff should be 1 inch above the crease of the elbow. . Ideally, take 3 measurements at one sitting and record the average.

## 2017-03-29 DIAGNOSIS — J321 Chronic frontal sinusitis: Secondary | ICD-10-CM | POA: Diagnosis not present

## 2017-04-20 DIAGNOSIS — H524 Presbyopia: Secondary | ICD-10-CM | POA: Diagnosis not present

## 2017-04-20 DIAGNOSIS — H52223 Regular astigmatism, bilateral: Secondary | ICD-10-CM | POA: Diagnosis not present

## 2017-04-20 DIAGNOSIS — H5213 Myopia, bilateral: Secondary | ICD-10-CM | POA: Diagnosis not present

## 2017-04-20 DIAGNOSIS — H59032 Cystoid macular edema following cataract surgery, left eye: Secondary | ICD-10-CM | POA: Diagnosis not present

## 2017-04-20 DIAGNOSIS — H1045 Other chronic allergic conjunctivitis: Secondary | ICD-10-CM | POA: Diagnosis not present

## 2017-04-20 DIAGNOSIS — H02402 Unspecified ptosis of left eyelid: Secondary | ICD-10-CM | POA: Diagnosis not present

## 2017-04-20 DIAGNOSIS — Z0100 Encounter for examination of eyes and vision without abnormal findings: Secondary | ICD-10-CM | POA: Diagnosis not present

## 2017-04-20 DIAGNOSIS — H2513 Age-related nuclear cataract, bilateral: Secondary | ICD-10-CM | POA: Diagnosis not present

## 2017-06-07 DIAGNOSIS — K219 Gastro-esophageal reflux disease without esophagitis: Secondary | ICD-10-CM | POA: Diagnosis not present

## 2017-06-07 DIAGNOSIS — R609 Edema, unspecified: Secondary | ICD-10-CM | POA: Diagnosis not present

## 2017-06-07 DIAGNOSIS — E782 Mixed hyperlipidemia: Secondary | ICD-10-CM | POA: Diagnosis not present

## 2017-06-07 DIAGNOSIS — M109 Gout, unspecified: Secondary | ICD-10-CM | POA: Diagnosis not present

## 2017-06-07 DIAGNOSIS — I10 Essential (primary) hypertension: Secondary | ICD-10-CM | POA: Diagnosis not present

## 2017-08-17 DIAGNOSIS — Z23 Encounter for immunization: Secondary | ICD-10-CM | POA: Diagnosis not present

## 2017-08-30 ENCOUNTER — Other Ambulatory Visit: Payer: Self-pay | Admitting: *Deleted

## 2017-08-30 ENCOUNTER — Telehealth: Payer: Self-pay | Admitting: Cardiology

## 2017-08-30 MED ORDER — AMLODIPINE BESYLATE 10 MG PO TABS: 10.0000 mg | ORAL_TABLET | Freq: Every day | ORAL | 0 refills | Status: DC

## 2017-08-30 NOTE — Telephone Encounter (Signed)
New Message       *STAT* If patient is at the pharmacy, call can be transferred to refill team.   1. Which medications need to be refilled? (please list name of each medication and dose if known)  amLODipine (NORVASC) 10 MG tablet Take 1 tablet (10 mg total) by mouth daily.     2. Which pharmacy/location (including street and city if local pharmacy) is medication to be sent to  [facility] [place] [id number]  3. Do they need a 30 day or 90 day supply? 72   Lost his medication needs new script called in

## 2017-08-30 NOTE — Telephone Encounter (Signed)
F/u message  Pt retuning call to f/u on medication refill. Please call back to discuss

## 2017-08-30 NOTE — Telephone Encounter (Signed)
Rx sent in to requested pharmacy.  

## 2017-09-20 DIAGNOSIS — H5213 Myopia, bilateral: Secondary | ICD-10-CM | POA: Diagnosis not present

## 2017-09-20 DIAGNOSIS — H52223 Regular astigmatism, bilateral: Secondary | ICD-10-CM | POA: Diagnosis not present

## 2017-09-20 DIAGNOSIS — H43392 Other vitreous opacities, left eye: Secondary | ICD-10-CM | POA: Diagnosis not present

## 2017-09-20 DIAGNOSIS — H35342 Macular cyst, hole, or pseudohole, left eye: Secondary | ICD-10-CM | POA: Diagnosis not present

## 2017-09-20 DIAGNOSIS — H1045 Other chronic allergic conjunctivitis: Secondary | ICD-10-CM | POA: Diagnosis not present

## 2017-09-20 DIAGNOSIS — H43812 Vitreous degeneration, left eye: Secondary | ICD-10-CM | POA: Diagnosis not present

## 2017-09-20 DIAGNOSIS — H2513 Age-related nuclear cataract, bilateral: Secondary | ICD-10-CM | POA: Diagnosis not present

## 2017-09-20 DIAGNOSIS — H524 Presbyopia: Secondary | ICD-10-CM | POA: Diagnosis not present

## 2017-09-20 DIAGNOSIS — H02402 Unspecified ptosis of left eyelid: Secondary | ICD-10-CM | POA: Diagnosis not present

## 2017-11-02 DIAGNOSIS — D1801 Hemangioma of skin and subcutaneous tissue: Secondary | ICD-10-CM | POA: Diagnosis not present

## 2017-11-02 DIAGNOSIS — L821 Other seborrheic keratosis: Secondary | ICD-10-CM | POA: Diagnosis not present

## 2017-11-02 DIAGNOSIS — D225 Melanocytic nevi of trunk: Secondary | ICD-10-CM | POA: Diagnosis not present

## 2017-11-02 DIAGNOSIS — L814 Other melanin hyperpigmentation: Secondary | ICD-10-CM | POA: Diagnosis not present

## 2017-11-30 ENCOUNTER — Other Ambulatory Visit: Payer: Self-pay | Admitting: Cardiology

## 2018-01-12 DIAGNOSIS — H43812 Vitreous degeneration, left eye: Secondary | ICD-10-CM | POA: Diagnosis not present

## 2018-01-23 NOTE — Progress Notes (Signed)
Cardiology Office Note:    Date:  01/24/2018   ID:  Jacob Mcclain, DOB [birth year], MRN [id number]  PCP:  Antony Contras, MD  Cardiologist:  No primary care provider on file.    Referring MD: Antony Contras, MD   Chief Complaint  Patient presents with  . Follow-up    Pericarditis and HTN    History of Present Illness:    Jacob Mcclain is a 72 y.o. male with a hx of Pericarditis and HTN.  He is here today for followup and is doing well.  He denies any chest pain or pressure, SOB, DOE, PND, orthopnea,  dizziness, palpitations or syncope. He occasionally has some mile LE edema if he has been driving a long distance.  He is compliant with his meds and is tolerating meds with no SE.  He is walking for exercise with no problems.   Past Medical History:  Diagnosis Date  . Abnormal EKG 11/19/2016   LVH with repolarization abnormality  . Ankylosing spondylitis ([place])   . Diastolic dysfunction   . GERD (gastroesophageal reflux disease)   . Gout    Last flare 7/09  . History of hemorrhoids   . History of squamous cell carcinoma    Followed by Dr Allyson Sabal  . Hypercholesterolemia   . Hypertension   . Insomnia   . Pericarditis     History reviewed. No pertinent surgical history.  Current Medications: Current Meds  Medication Sig  . amLODipine (NORVASC) 10 MG tablet TAKE 1 TABLET EVERY DAY  . atorvastatin (LIPITOR) 20 MG tablet Take 20 mg by mouth daily.  . multivitamin (ONE-A-DAY MEN'S) TABS tablet Take 1 tablet by mouth daily.  . nebivolol (BYSTOLIC) 5 MG tablet Take 5 mg by mouth daily.  . Omega-3 Fatty Acids (FISH OIL) 1000 MG CAPS Take 1 capsule by mouth daily.      Allergies:   Patient has no known allergies.   Social History   Socioeconomic History  . Marital status: Married    Spouse name: Not on file  . Number of children: Not on file  . Years of education: Not on file  . Highest education level: Not on file  Occupational History  . Not on file  Social Needs  .  Financial resource strain: Not on file  . Food insecurity:    Worry: Not on file    Inability: Not on file  . Transportation needs:    Medical: Not on file    Non-medical: Not on file  Tobacco Use  . Smoking status: Former Smoker    Types: Cigarettes  . Smokeless tobacco: Never Used  Substance and Sexual Activity  . Alcohol use: Yes    Alcohol/week: 4.2 oz    Types: 7 Glasses of wine per week    Comment: 2-3 beers weekly  . Drug use: No  . Sexual activity: Not on file  Lifestyle  . Physical activity:    Days per week: Not on file    Minutes per session: Not on file  . Stress: Not on file  Relationships  . Social connections:    Talks on phone: Not on file    Gets together: Not on file    Attends religious service: Not on file    Active member of club or organization: Not on file    Attends meetings of clubs or organizations: Not on file    Relationship status: Not on file  Other Topics Concern  . Not on file  Social History Narrative  . Not on file     Family History: The patient's family history includes Lung cancer in his father.  ROS:   Please see the history of present illness.    ROS  All other systems reviewed and negative.   EKGs/Labs/Other Studies Reviewed:    The following studies were reviewed today: none  EKG:  EKG is  ordered today.  The ekg ordered today demonstrates NSR at 89bpm with no ST changes  Recent Labs: No results found for requested labs within last 8760 hours.   Recent Lipid Panel No results found for: CHOL, TRIG, HDL, CHOLHDL, VLDL, LDLCALC, LDLDIRECT  Physical Exam:    VS:  BP 138/84   Pulse 89   Ht 5\' 11"  (1.803 m)   Wt 208 lb (94.3 kg)   SpO2 96%   BMI 29.01 kg/m     Wt Readings from Last 3 Encounters:  01/24/18 208 lb (94.3 kg)  11/26/16 209 lb 8 oz (95 kg)  11/19/16 209 lb 1.9 oz (94.9 kg)     GEN:  Well nourished, well developed in no acute distress HEENT: Normal NECK: No JVD; No carotid bruits LYMPHATICS: No  lymphadenopathy CARDIAC: RRR, no murmurs, rubs, gallops RESPIRATORY:  Clear to auscultation without rales, wheezing or rhonchi  ABDOMEN: Soft, non-tender, non-distended MUSCULOSKELETAL:  No edema; No deformity  SKIN: Warm and dry NEUROLOGIC:  Alert and oriented x 3 PSYCHIATRIC:  Normal affect   ASSESSMENT:    1. H/O pericarditis   2. Essential hypertension, benign    PLAN:    In order of problems listed above:  1.  History of Pericarditis - he has not had any reoccurrence and denies any CP or SOB.  2.  HTN - BP is well controlled on exam today.  He will continue on amlodipine 10mg  daily and Bystolic 5mg  daily.     Medication Adjustments/Labs and Tests Ordered: Current medicines are reviewed at length with the patient today.  Concerns regarding medicines are outlined above.  No orders of the defined types were placed in this encounter.  No orders of the defined types were placed in this encounter.   Signed, Fransico Him, MD  01/24/2018 9:15 AM    [place]

## 2018-01-24 ENCOUNTER — Ambulatory Visit: Payer: Medicare HMO | Admitting: Cardiology

## 2018-01-24 ENCOUNTER — Encounter: Payer: Self-pay | Admitting: Cardiology

## 2018-01-24 VITALS — BP 138/84 | HR 89 | Ht 71.0 in | Wt 208.0 lb

## 2018-01-24 DIAGNOSIS — Z8679 Personal history of other diseases of the circulatory system: Secondary | ICD-10-CM | POA: Diagnosis not present

## 2018-01-24 DIAGNOSIS — I10 Essential (primary) hypertension: Secondary | ICD-10-CM

## 2018-01-24 NOTE — Patient Instructions (Signed)
Medication Instructions:  Your physician recommends that you continue on your current medications as directed. Please refer to the Current Medication list given to you today.  If you need a refill on your cardiac medications, please contact your pharmacy first.  Labwork: None ordered   Testing/Procedures: None ordered   Follow-Up: Your physician wants you to follow-up in: 1 year with Dr. Turner. You will receive a reminder letter in the mail two months in advance. If you don't receive a letter, please call our office to schedule the follow-up appointment.  Any Other Special Instructions Will Be Listed Below (If Applicable).   Thank you for choosing [facility]    Rena Dian Minahan, RN  [phone]  If you need a refill on your cardiac medications before your next appointment, please call your pharmacy.   

## 2018-01-28 DIAGNOSIS — E782 Mixed hyperlipidemia: Secondary | ICD-10-CM | POA: Diagnosis not present

## 2018-01-28 DIAGNOSIS — Z125 Encounter for screening for malignant neoplasm of prostate: Secondary | ICD-10-CM | POA: Diagnosis not present

## 2018-01-28 DIAGNOSIS — Z0000 Encounter for general adult medical examination without abnormal findings: Secondary | ICD-10-CM | POA: Diagnosis not present

## 2018-01-28 DIAGNOSIS — I10 Essential (primary) hypertension: Secondary | ICD-10-CM | POA: Diagnosis not present

## 2018-01-28 DIAGNOSIS — N529 Male erectile dysfunction, unspecified: Secondary | ICD-10-CM | POA: Diagnosis not present

## 2018-01-28 DIAGNOSIS — L821 Other seborrheic keratosis: Secondary | ICD-10-CM | POA: Diagnosis not present

## 2018-01-28 DIAGNOSIS — M109 Gout, unspecified: Secondary | ICD-10-CM | POA: Diagnosis not present

## 2018-01-28 DIAGNOSIS — L989 Disorder of the skin and subcutaneous tissue, unspecified: Secondary | ICD-10-CM | POA: Diagnosis not present

## 2018-01-28 DIAGNOSIS — Z1389 Encounter for screening for other disorder: Secondary | ICD-10-CM | POA: Diagnosis not present

## 2018-03-10 DIAGNOSIS — L89899 Pressure ulcer of other site, unspecified stage: Secondary | ICD-10-CM | POA: Diagnosis not present

## 2018-07-29 DIAGNOSIS — I10 Essential (primary) hypertension: Secondary | ICD-10-CM | POA: Diagnosis not present

## 2018-07-29 DIAGNOSIS — Z8679 Personal history of other diseases of the circulatory system: Secondary | ICD-10-CM | POA: Diagnosis not present

## 2018-07-29 DIAGNOSIS — R609 Edema, unspecified: Secondary | ICD-10-CM | POA: Diagnosis not present

## 2018-07-29 DIAGNOSIS — N529 Male erectile dysfunction, unspecified: Secondary | ICD-10-CM | POA: Diagnosis not present

## 2018-07-29 DIAGNOSIS — Z23 Encounter for immunization: Secondary | ICD-10-CM | POA: Diagnosis not present

## 2018-07-29 DIAGNOSIS — E782 Mixed hyperlipidemia: Secondary | ICD-10-CM | POA: Diagnosis not present

## 2018-07-29 DIAGNOSIS — K219 Gastro-esophageal reflux disease without esophagitis: Secondary | ICD-10-CM | POA: Diagnosis not present

## 2018-07-29 DIAGNOSIS — F439 Reaction to severe stress, unspecified: Secondary | ICD-10-CM | POA: Diagnosis not present

## 2018-07-29 DIAGNOSIS — M109 Gout, unspecified: Secondary | ICD-10-CM | POA: Diagnosis not present

## 2018-09-14 DIAGNOSIS — H524 Presbyopia: Secondary | ICD-10-CM | POA: Diagnosis not present

## 2018-10-10 DIAGNOSIS — Z0100 Encounter for examination of eyes and vision without abnormal findings: Secondary | ICD-10-CM | POA: Diagnosis not present

## 2018-12-26 DIAGNOSIS — D229 Melanocytic nevi, unspecified: Secondary | ICD-10-CM | POA: Diagnosis not present

## 2018-12-26 DIAGNOSIS — L0109 Other impetigo: Secondary | ICD-10-CM | POA: Diagnosis not present

## 2018-12-26 DIAGNOSIS — L821 Other seborrheic keratosis: Secondary | ICD-10-CM | POA: Diagnosis not present

## 2018-12-26 DIAGNOSIS — L814 Other melanin hyperpigmentation: Secondary | ICD-10-CM | POA: Diagnosis not present

## 2018-12-26 DIAGNOSIS — L819 Disorder of pigmentation, unspecified: Secondary | ICD-10-CM | POA: Diagnosis not present

## 2018-12-26 DIAGNOSIS — D1801 Hemangioma of skin and subcutaneous tissue: Secondary | ICD-10-CM | POA: Diagnosis not present

## 2019-02-15 ENCOUNTER — Telehealth: Payer: Self-pay | Admitting: Cardiology

## 2019-02-15 NOTE — Telephone Encounter (Signed)
Video/doxy.me/smartphone/verbal consent 02/15/19/vitals  YOUR CARDIOLOGY TEAM HAS ARRANGED FOR AN E-VISIT FOR YOUR APPOINTMENT - PLEASE REVIEW IMPORTANT INFORMATION BELOW SEVERAL DAYS PRIOR TO YOUR APPOINTMENT  Due to the recent COVID-19 pandemic, we are transitioning in-person office visits to tele-medicine visits in an effort to decrease unnecessary exposure to our patients, their families, and staff. These visits are billed to your insurance just like a normal visit is. We also encourage you to sign up for MyChart if you have not already done so. You will need a smartphone if possible. For patients that do not have this, we can still complete the visit using a regular telephone but do prefer a smartphone to enable video when possible. You may have a family member that lives with you that can help. If possible, we also ask that you have a blood pressure cuff and scale at home to measure your blood pressure, heart rate and weight prior to your scheduled appointment. Patients with clinical needs that need an in-person evaluation and testing will still be able to come to the office if absolutely necessary. If you have any questions, feel free to call our office.      YOUR PROVIDER WILL BE USING THE FOLLOWING PLATFORM TO COMPLETE YOUR VISIT:  [email or web address]   IF USING MYCHART - How to Download the MyChart App to Your SmartPhone   - If Apple, go to [employer] and type in MyChart in the search bar and download the app. If Android, ask patient to go to [employer] and type in [place] in the search bar and download the app. The app is free but as with any other app downloads, your phone may require you to verify saved payment information or Apple/Android password.  - You will need to then log into the app with your MyChart username and password, and select [facility] as your healthcare provider to link the account.  - When it is time for your visit, go to the MyChart app, find appointments, and click  Begin Video Visit. Be sure to Select Allow for your device to access the Microphone and Camera for your visit. You will then be connected, and your provider will be with you shortly.  **If you have any issues connecting or need assistance, please contact MyChart service desk [phone])**  **If using a computer, in order to ensure the best quality for your visit, you will need to use either of the following Internet Browsers: Insurance underwriter or [employer] Edge**   IF USING DOXIMITY or [email or web address].ME - The staff will give you instructions on receiving your link to join the meeting the day of your visit.      2-3 DAYS BEFORE YOUR APPOINTMENT  You will receive a telephone call from one of our [facility] team members - your caller ID may say "Unknown caller." If this is a video visit, we will walk you through how to get the video launched on your phone. We will remind you check your blood pressure, heart rate and weight prior to your scheduled appointment. If you have an Apple Watch or Kardia, please upload any pertinent ECG strips the day before or morning of your appointment to [facility]. Our staff will also make sure you have reviewed the consent and agree to move forward with your scheduled tele-health visit.     THE DAY OF YOUR APPOINTMENT  Approximately 15 minutes prior to your scheduled appointment, you will receive a telephone call from one of [facility] team - your caller  ID may say "Unknown caller."  Our staff will confirm medications, vital signs for the day and any symptoms you may be experiencing. Please have this information available prior to the time of visit start. It may also be helpful for you to have a pad of paper and pen handy for any instructions given during your visit. They will also walk you through joining the smartphone meeting if this is a video visit.    CONSENT FOR TELE-HEALTH VISIT - PLEASE REVIEW  I hereby voluntarily request, consent and authorize [facility] and its employed or contracted physicians, physician assistants, nurse practitioners or other licensed health care professionals (the Practitioner), to provide me with telemedicine health care services (the Services") as deemed necessary by the treating Practitioner. I acknowledge and consent to receive the Services by the Practitioner via telemedicine. I understand that the telemedicine visit will involve communicating with the Practitioner through live audiovisual communication technology and the disclosure of certain medical information by electronic transmission. I acknowledge that I have been given the opportunity to request an in-person assessment or other available alternative prior to the telemedicine visit and am voluntarily participating in the telemedicine visit.  I understand that I have the right to withhold or withdraw my consent to the use of telemedicine in the course of my care at any time, without affecting my right to future care or treatment, and that the Practitioner or I may terminate the telemedicine visit at any time. I understand that I have the right to inspect all information obtained and/or recorded in the course of the telemedicine visit and may receive copies of available information for a reasonable fee.  I understand that some of the potential risks of receiving the Services via telemedicine include:   Delay or interruption in medical evaluation due to technological equipment failure or disruption;  Information transmitted may not be sufficient (e.g. poor resolution of images) to allow for appropriate medical decision making by the Practitioner; and/or   In rare instances, security protocols could fail, causing a breach of personal health information.  Furthermore, I acknowledge that it is my responsibility to provide information about my medical history, conditions and care that is complete and accurate to the best of my ability. I acknowledge that Practitioner's  advice, recommendations, and/or decision may be based on factors not within their control, such as incomplete or inaccurate data provided by me or distortions of diagnostic images or specimens that may result from electronic transmissions. I understand that the practice of medicine is not an exact science and that Practitioner makes no warranties or guarantees regarding treatment outcomes. I acknowledge that I will receive a copy of this consent concurrently upon execution via email to the email address I last provided but may also request a printed copy by calling the office of [facility].    I understand that my insurance will be billed for this visit.   I have read or had this consent read to me.  I understand the contents of this consent, which adequately explains the benefits and risks of the Services being provided via telemedicine.   I have been provided ample opportunity to ask questions regarding this consent and the Services and have had my questions answered to my satisfaction.  I give my informed consent for the services to be provided through the use of telemedicine in my medical care  By participating in this telemedicine visit I agree to the above.

## 2019-02-19 NOTE — Progress Notes (Signed)
Virtual Visit via Video Note   This visit type was conducted due to national recommendations for restrictions regarding the COVID-19 Pandemic (e.g. social distancing) in an effort to limit this patient's exposure and mitigate transmission in our community.  Due to his co-morbid illnesses, this patient is at least at moderate risk for complications without adequate follow up.  This format is felt to be most appropriate for this patient at this time.  All issues noted in this document were discussed and addressed.  A limited physical exam was performed with this format.  Please refer to the patient's chart for his consent to telehealth for [facility].   Evaluation Performed:  Follow-up visit  This visit type was conducted due to national recommendations for restrictions regarding the COVID-19 Pandemic (e.g. social distancing).  This format is felt to be most appropriate for this patient at this time.  All issues noted in this document were discussed and addressed.  No physical exam was performed (except for noted visual exam findings with Video Visits).  Please refer to the patient's chart (MyChart message for video visits and phone note for telephone visits) for the patient's consent to telehealth for [facility].  Date:  02/20/2019   ID:  Jacob Mcclain, DOB 15-Feb-1946, MRN [id number]  Patient Location:  Home  Provider location:   [place]  PCP:  Antony Contras, MD  Cardiologist:  Fransico Him, MD Electrophysiologist:  None   Chief Complaint:  followup for Pericarditis and HTN2  History of Present Illness:    Jacob Mcclain is a 73 y.o. male who presents via audio/video conferencing for a telehealth visit today.    Jacob Mcclain is a 73 y.o. male with a hx of Pericarditis and HTN. He is here today for followup and is doing well.  He denies any chest pain or pressure, SOB, DOE, PND, orthopnea, LE edema, dizziness, palpitations or syncope. He is compliant with his meds and is  tolerating meds with no SE.    The patient does not have symptoms concerning for COVID-19 infection (fever, chills, cough, or new shortness of breath).    Prior CV studies:   The following studies were reviewed today  2D echo 2013  Past Medical History:  Diagnosis Date  . Abnormal EKG 11/19/2016   LVH with repolarization abnormality  . Ankylosing spondylitis ([place])   . Diastolic dysfunction   . GERD (gastroesophageal reflux disease)   . Gout    Last flare 7/09  . History of hemorrhoids   . History of squamous cell carcinoma    Followed by Dr Allyson Sabal  . Hypercholesterolemia   . Hypertension   . Insomnia   . Pericarditis    No past surgical history on file.   Current Meds  Medication Sig  . amLODipine (NORVASC) 10 MG tablet TAKE 1 TABLET EVERY DAY  . aspirin EC 81 MG tablet Take 81 mg by mouth daily.  Marland Kitchen atorvastatin (LIPITOR) 20 MG tablet Take 20 mg by mouth daily.  . multivitamin (ONE-A-DAY MEN'S) TABS tablet Take 1 tablet by mouth daily.  . nebivolol (BYSTOLIC) 5 MG tablet Take 5 mg by mouth daily.  . Omega-3 Fatty Acids (FISH OIL) 1000 MG CAPS Take 1 capsule by mouth daily.      Allergies:   Patient has no known allergies.   Social History   Tobacco Use  . Smoking status: Former Smoker    Types: Cigarettes  . Smokeless tobacco: Never Used  Substance Use Topics  .  Alcohol use: Yes    Alcohol/week: 7.0 standard drinks    Types: 7 Glasses of wine per week    Comment: 2-3 beers weekly  . Drug use: No     Family Hx: The patient's family history includes Lung cancer in his father.  ROS:   Please see the history of present illness.     All other systems reviewed and are negative.   Labs/Other Tests and Data Reviewed:    Recent Labs: No results found for requested labs within last 8760 hours.   Recent Lipid Panel No results found for: CHOL, TRIG, HDL, CHOLHDL, LDLCALC, LDLDIRECT  Wt Readings from Last 3 Encounters:  02/20/19 207 lb (93.9 kg)  01/24/18  208 lb (94.3 kg)  11/26/16 209 lb 8 oz (95 kg)     Objective:    Vital Signs:  BP 136/74   Pulse 82   Ht 5\' 11"  (1.803 m)   Wt 207 lb (93.9 kg)   BMI 28.87 kg/m    CONSTITUTIONAL:  Well nourished, well developed male in no acute distress.  EYES: anicteric MOUTH: oral mucosa is pink RESPIRATORY: Normal respiratory effort, symmetric expansion CARDIOVASCULAR: No peripheral edema SKIN: No rash, lesions or ulcers MUSCULOSKELETAL: no digital cyanosis NEURO: Cranial Nerves II-XII grossly intact, moves all extremities PSYCH: Intact judgement and insight.  A&O x 3, Mood/affect appropriate   ASSESSMENT & PLAN:    1.  History of Pericarditis -he has not had any recurrence of pericarditis and denies any chest pain or shortness of breath.  2.  HTN - he occasionally checks his blood pressure at home and it is been controlled.  He will continue on amlodipine 10 mg daily and Bystolic 5 mg daily.  3.  COVID-19 Education:The signs and symptoms of COVID-19 were discussed with the patient and how to seek care for testing (follow up with PCP or arrange E-visit).  The importance of social distancing was discussed today.  Patient Risk:   After full review of this patient's clinical status, I feel that they are at least moderate risk at this time.  Time:   Today, I have spent 10 minutes directly with the patient on video discussing medical problems including pericarditis and its symptoms and BP control.  We also reviewed the symptoms of COVID 19 and the ways to protect against contracting the virus with telehealth technology.  I spent an additional 5 minutes reviewing patient's chart including 2D echo 2013.  Medication Adjustments/Labs and Tests Ordered: Current medicines are reviewed at length with the patient today.  Concerns regarding medicines are outlined above.  Tests Ordered: No orders of the defined types were placed in this encounter.  Medication Changes: No orders of the defined types  were placed in this encounter.   Disposition:  Follow up in 1 year(s)  Signed, Fransico Him, MD  02/20/2019 9:31 AM    [facility]

## 2019-02-20 ENCOUNTER — Telehealth (INDEPENDENT_AMBULATORY_CARE_PROVIDER_SITE_OTHER): Payer: Medicare HMO | Admitting: Cardiology

## 2019-02-20 ENCOUNTER — Other Ambulatory Visit: Payer: Self-pay

## 2019-02-20 ENCOUNTER — Encounter: Payer: Self-pay | Admitting: Cardiology

## 2019-02-20 VITALS — BP 136/74 | HR 82 | Ht 71.0 in | Wt 207.0 lb

## 2019-02-20 DIAGNOSIS — Z8679 Personal history of other diseases of the circulatory system: Secondary | ICD-10-CM | POA: Diagnosis not present

## 2019-02-20 DIAGNOSIS — I10 Essential (primary) hypertension: Secondary | ICD-10-CM | POA: Diagnosis not present

## 2019-02-20 DIAGNOSIS — Z7189 Other specified counseling: Secondary | ICD-10-CM | POA: Diagnosis not present

## 2019-02-20 NOTE — Patient Instructions (Signed)

## 2019-02-27 DIAGNOSIS — Z0000 Encounter for general adult medical examination without abnormal findings: Secondary | ICD-10-CM | POA: Diagnosis not present

## 2019-02-27 DIAGNOSIS — R609 Edema, unspecified: Secondary | ICD-10-CM | POA: Diagnosis not present

## 2019-02-27 DIAGNOSIS — I10 Essential (primary) hypertension: Secondary | ICD-10-CM | POA: Diagnosis not present

## 2019-02-27 DIAGNOSIS — E782 Mixed hyperlipidemia: Secondary | ICD-10-CM | POA: Diagnosis not present

## 2019-02-27 DIAGNOSIS — N529 Male erectile dysfunction, unspecified: Secondary | ICD-10-CM | POA: Diagnosis not present

## 2019-02-27 DIAGNOSIS — K219 Gastro-esophageal reflux disease without esophagitis: Secondary | ICD-10-CM | POA: Diagnosis not present

## 2019-02-27 DIAGNOSIS — M109 Gout, unspecified: Secondary | ICD-10-CM | POA: Diagnosis not present

## 2019-02-27 DIAGNOSIS — Z1389 Encounter for screening for other disorder: Secondary | ICD-10-CM | POA: Diagnosis not present

## 2019-02-27 DIAGNOSIS — I77811 Abdominal aortic ectasia: Secondary | ICD-10-CM | POA: Diagnosis not present

## 2019-03-10 DIAGNOSIS — E782 Mixed hyperlipidemia: Secondary | ICD-10-CM | POA: Diagnosis not present

## 2019-03-10 DIAGNOSIS — M109 Gout, unspecified: Secondary | ICD-10-CM | POA: Diagnosis not present

## 2019-03-10 DIAGNOSIS — Z125 Encounter for screening for malignant neoplasm of prostate: Secondary | ICD-10-CM | POA: Diagnosis not present

## 2019-03-27 DIAGNOSIS — H40013 Open angle with borderline findings, low risk, bilateral: Secondary | ICD-10-CM | POA: Diagnosis not present

## 2019-08-22 DIAGNOSIS — Z23 Encounter for immunization: Secondary | ICD-10-CM | POA: Diagnosis not present

## 2019-08-29 DIAGNOSIS — N529 Male erectile dysfunction, unspecified: Secondary | ICD-10-CM | POA: Diagnosis not present

## 2019-08-29 DIAGNOSIS — Z8679 Personal history of other diseases of the circulatory system: Secondary | ICD-10-CM | POA: Diagnosis not present

## 2019-08-29 DIAGNOSIS — K219 Gastro-esophageal reflux disease without esophagitis: Secondary | ICD-10-CM | POA: Diagnosis not present

## 2019-08-29 DIAGNOSIS — M109 Gout, unspecified: Secondary | ICD-10-CM | POA: Diagnosis not present

## 2019-08-29 DIAGNOSIS — R609 Edema, unspecified: Secondary | ICD-10-CM | POA: Diagnosis not present

## 2019-08-29 DIAGNOSIS — I77811 Abdominal aortic ectasia: Secondary | ICD-10-CM | POA: Diagnosis not present

## 2019-08-29 DIAGNOSIS — E782 Mixed hyperlipidemia: Secondary | ICD-10-CM | POA: Diagnosis not present

## 2019-08-29 DIAGNOSIS — I10 Essential (primary) hypertension: Secondary | ICD-10-CM | POA: Diagnosis not present

## 2019-09-18 DIAGNOSIS — E782 Mixed hyperlipidemia: Secondary | ICD-10-CM | POA: Diagnosis not present

## 2019-09-26 DIAGNOSIS — H524 Presbyopia: Secondary | ICD-10-CM | POA: Diagnosis not present

## 2019-10-31 DIAGNOSIS — I10 Essential (primary) hypertension: Secondary | ICD-10-CM | POA: Diagnosis not present

## 2019-10-31 DIAGNOSIS — I77811 Abdominal aortic ectasia: Secondary | ICD-10-CM | POA: Diagnosis not present

## 2019-10-31 DIAGNOSIS — E782 Mixed hyperlipidemia: Secondary | ICD-10-CM | POA: Diagnosis not present

## 2019-10-31 DIAGNOSIS — N400 Enlarged prostate without lower urinary tract symptoms: Secondary | ICD-10-CM | POA: Diagnosis not present

## 2019-11-24 DIAGNOSIS — Z8249 Family history of ischemic heart disease and other diseases of the circulatory system: Secondary | ICD-10-CM | POA: Diagnosis not present

## 2019-11-24 DIAGNOSIS — I77811 Abdominal aortic ectasia: Secondary | ICD-10-CM | POA: Diagnosis not present

## 2019-11-26 ENCOUNTER — Ambulatory Visit: Payer: Medicare HMO

## 2019-12-01 ENCOUNTER — Ambulatory Visit: Payer: Medicare HMO

## 2019-12-03 ENCOUNTER — Ambulatory Visit: Payer: Medicare HMO

## 2020-02-12 DIAGNOSIS — N400 Enlarged prostate without lower urinary tract symptoms: Secondary | ICD-10-CM | POA: Diagnosis not present

## 2020-02-12 DIAGNOSIS — I10 Essential (primary) hypertension: Secondary | ICD-10-CM | POA: Diagnosis not present

## 2020-02-12 DIAGNOSIS — E782 Mixed hyperlipidemia: Secondary | ICD-10-CM | POA: Diagnosis not present

## 2020-02-22 ENCOUNTER — Ambulatory Visit: Payer: Medicare HMO | Admitting: Cardiology

## 2020-02-22 ENCOUNTER — Encounter: Payer: Self-pay | Admitting: Cardiology

## 2020-02-22 ENCOUNTER — Other Ambulatory Visit: Payer: Self-pay

## 2020-02-22 VITALS — BP 148/82 | HR 79 | Ht 71.0 in | Wt 216.2 lb

## 2020-02-22 DIAGNOSIS — Z8679 Personal history of other diseases of the circulatory system: Secondary | ICD-10-CM

## 2020-02-22 DIAGNOSIS — R600 Localized edema: Secondary | ICD-10-CM | POA: Diagnosis not present

## 2020-02-22 DIAGNOSIS — I10 Essential (primary) hypertension: Secondary | ICD-10-CM

## 2020-02-22 DIAGNOSIS — E669 Obesity, unspecified: Secondary | ICD-10-CM

## 2020-02-22 NOTE — Progress Notes (Signed)
Date:  02/22/2020   ID:  Jacob Mcclain, DOB 1946/09/06, MRN [id number]  PCP:  Antony Contras, MD  Cardiologist:  Fransico Him, MD Electrophysiologist:  None   Chief Complaint:  followup for Pericarditis and HTN  History of Present Illness:    Jacob Mcclain is a 74 y.o. male who presents via audio/video conferencing for a telehealth visit today.    Jacob Mcclain is a 74 y.o. male with a hx of Pericarditis and HTN. He is here today for followup and is doing well.  He denies any chest pain or pressure, SOB, DOE, PND, orthopnea, dizziness, palpitations or syncope. He does occasionally have LE edema but he salts his food and does the cooking.  He is compliant with his meds and is tolerating meds with no SE.    The patient does not have symptoms concerning for COVID-19 infection (fever, chills, cough, or new shortness of breath).    Prior CV studies:   The following studies were reviewed today  none  Past Medical History:  Diagnosis Date  . Abnormal EKG 11/19/2016   LVH with repolarization abnormality  . Ankylosing spondylitis ([place])   . Diastolic dysfunction   . GERD (gastroesophageal reflux disease)   . Gout    Last flare 7/09  . History of hemorrhoids   . History of squamous cell carcinoma    Followed by Dr Allyson Sabal  . Hypercholesterolemia   . Hypertension   . Insomnia   . Pericarditis    No past surgical history on file.   Current Meds  Medication Sig  . amLODipine (NORVASC) 10 MG tablet TAKE 1 TABLET EVERY DAY  . aspirin EC 81 MG tablet Take 81 mg by mouth daily.  Marland Kitchen atorvastatin (LIPITOR) 20 MG tablet Take 20 mg by mouth daily.  . multivitamin (ONE-A-DAY MEN'S) TABS tablet Take 1 tablet by mouth daily.  . nebivolol (BYSTOLIC) 5 MG tablet Take 5 mg by mouth daily.  . Omega-3 Fatty Acids (FISH OIL) 1000 MG CAPS Take 1 capsule by mouth daily.      Allergies:   Patient has no known allergies.   Social History   Tobacco Use  . Smoking status: Former Smoker    Types:  Cigarettes  . Smokeless tobacco: Never Used  Substance Use Topics  . Alcohol use: Yes    Alcohol/week: 7.0 standard drinks    Types: 7 Glasses of wine per week    Comment: 2-3 beers weekly  . Drug use: No     Family Hx: The patient's family history includes Lung cancer in his father.  ROS:   Please see the history of present illness.     All other systems reviewed and are negative.   Labs/Other Tests and Data Reviewed:    Recent Labs: No results found for requested labs within last 8760 hours.   Recent Lipid Panel No results found for: CHOL, TRIG, HDL, CHOLHDL, LDLCALC, LDLDIRECT  Wt Readings from Last 3 Encounters:  02/22/20 216 lb 3.2 oz (98.1 kg)  02/20/19 207 lb (93.9 kg)  01/24/18 208 lb (94.3 kg)     Objective:    Vital Signs:  BP (!) 148/82   Pulse 79   Ht 5\' 11"  (1.803 m)   Wt 216 lb 3.2 oz (98.1 kg)   BMI 30.15 kg/m    GEN: Well nourished, well developed in no acute distress HEENT: Normal NECK: No JVD; No carotid bruits LYMPHATICS: No lymphadenopathy CARDIAC:RRR, no murmurs, rubs, gallops RESPIRATORY:  Clear to auscultation without rales, wheezing or rhonchi  ABDOMEN: Soft, non-tender, non-distended MUSCULOSKELETAL:  1+ pretibial edema; No deformity  SKIN: Warm and dry NEUROLOGIC:  Alert and oriented x 3 PSYCHIATRIC:  Normal affect    ASSESSMENT & PLAN:    1.  History of Pericarditis  -he has not had any recurrence of pericarditis and denies any chest pain or shortness of breath.  2.  HTN  -BP is borderline elevated on exam today -continue amlodipine 10mg  daily and Bystolic 5mg  daily -I encouraged to avoid using added salt in his diet and use salt substitute -at home SBP is running 130-140's. -encouraged him to exercise  3.  Obesity -I have encouraged him to get into a routine exercise program and cut back on carbs and portions.   4.  LE edema -mild on exam -likely related to added Na in his diet -avoid added salt and no more than 2gm  Na daily -he will let me know if edema does not resolve and then would add a diuretic Patient Risk:   After full review of this patient's clinical status, I feel that they are at least moderate risk at this time.  Time:   Today, I have spent 10 minutes directly with the patient on video discussing medical problems including pericarditis and its symptoms and BP control.  We also reviewed the symptoms of COVID 19 and the ways to protect against contracting the virus with telehealth technology.  I spent an additional 5 minutes reviewing patient's chart including 2D echo 2013.  Medication Adjustments/Labs and Tests Ordered: Current medicines are reviewed at length with the patient today.  Concerns regarding medicines are outlined above.  Tests Ordered: Orders Placed This Encounter  Procedures  . EKG 12-Lead   Medication Changes: No orders of the defined types were placed in this encounter.   Disposition:  Follow up in 1 year(s)  Signed, Fransico Him, MD  02/22/2020 4:26 PM    [place]

## 2020-02-22 NOTE — Patient Instructions (Signed)
Medication Instructions:  Your physician recommends that you continue on your current medications as directed. Please refer to the Current Medication list given to you today.  *If you need a refill on your cardiac medications before your next appointment, please call your pharmacy*   Follow-Up: At [facility], you and your health needs are our priority.  As part of our continuing mission to provide you with exceptional heart care, we have created designated Provider Care Teams.  These Care Teams include your primary Cardiologist (physician) and Advanced Practice Providers (APPs -  Physician Assistants and Nurse Practitioners) who all work together to provide you with the care you need, when you need it.  Your next appointment:   1 year(s)  The format for your next appointment:   In Person  Provider:   You may see Fransico Him, MD or one of the following Advanced Practice Providers on your designated Care Team:    Melina Copa, PA-C  Ermalinda Barrios, PA-C   Other Instructions It is recommended that you consume less than 2 grams of sodium per day.   Low-Sodium Eating Plan Sodium, which is an element that makes up salt, helps you maintain a healthy balance of fluids in your body. Too much sodium can increase your blood pressure and cause fluid and waste to be held in your body. Your health care provider or dietitian may recommend following this plan if you have high blood pressure (hypertension), kidney disease, liver disease, or heart failure. Eating less sodium can help lower your blood pressure, reduce swelling, and protect your heart, liver, and kidneys. What are tips for following this plan? General guidelines  Most people on this plan should limit their sodium intake to 1,500-2,000 mg (milligrams) of sodium each day. Reading food labels   The Nutrition Facts label lists the amount of sodium in one serving of the food. If you eat more than one serving, you must multiply the  listed amount of sodium by the number of servings.  Choose foods with less than 140 mg of sodium per serving.  Avoid foods with 300 mg of sodium or more per serving. Shopping  Look for lower-sodium products, often labeled as "low-sodium" or "no salt added."  Always check the sodium content even if foods are labeled as "unsalted" or "no salt added".  Buy fresh foods. ? Avoid canned foods and premade or frozen meals. ? Avoid canned, cured, or processed meats  Buy breads that have less than 80 mg of sodium per slice. Cooking  Eat more home-cooked food and less restaurant, buffet, and fast food.  Avoid adding salt when cooking. Use salt-free seasonings or herbs instead of table salt or sea salt. Check with your health care provider or pharmacist before using salt substitutes.  Cook with plant-based oils, such as canola, sunflower, or olive oil. Meal planning  When eating at a restaurant, ask that your food be prepared with less salt or no salt, if possible.  Avoid foods that contain MSG (monosodium glutamate). MSG is sometimes added to [place] food, bouillon, and some canned foods. What foods are recommended? The items listed may not be a complete list. Talk with your dietitian about what dietary choices are best for you. Grains Low-sodium cereals, including oats, puffed wheat and rice, and shredded wheat. Low-sodium crackers. Unsalted rice. Unsalted pasta. Low-sodium bread. Whole-grain breads and whole-grain pasta. Vegetables Fresh or frozen vegetables. "No salt added" canned vegetables. "No salt added" tomato sauce and paste. Low-sodium or reduced-sodium tomato and vegetable  juice. Fruits Fresh, frozen, or canned fruit. Fruit juice. Meats and other protein foods Fresh or frozen (no salt added) meat, poultry, seafood, and fish. Low-sodium canned tuna and salmon. Unsalted nuts. Dried peas, beans, and lentils without added salt. Unsalted canned beans. Eggs. Unsalted nut  butters. Dairy Milk. Soy milk. Cheese that is naturally low in sodium, such as ricotta cheese, fresh mozzarella, or Swiss cheese Low-sodium or reduced-sodium cheese. Cream cheese. Yogurt. Fats and oils Unsalted butter. Unsalted margarine with no trans fat. Vegetable oils such as canola or olive oils. Seasonings and other foods Fresh and dried herbs and spices. Salt-free seasonings. Low-sodium mustard and ketchup. Sodium-free salad dressing. Sodium-free light mayonnaise. Fresh or refrigerated horseradish. Lemon juice. Vinegar. Homemade, reduced-sodium, or low-sodium soups. Unsalted popcorn and pretzels. Low-salt or salt-free chips. What foods are not recommended? The items listed may not be a complete list. Talk with your dietitian about what dietary choices are best for you. Grains Instant hot cereals. Bread stuffing, pancake, and biscuit mixes. Croutons. Seasoned rice or pasta mixes. Noodle soup cups. Boxed or frozen macaroni and cheese. Regular salted crackers. Self-rising flour. Vegetables Sauerkraut, pickled vegetables, and relishes. Olives. [place] fries. Onion rings. Regular canned vegetables (not low-sodium or reduced-sodium). Regular canned tomato sauce and paste (not low-sodium or reduced-sodium). Regular tomato and vegetable juice (not low-sodium or reduced-sodium). Frozen vegetables in sauces. Meats and other protein foods Meat or fish that is salted, canned, smoked, spiced, or pickled. Bacon, ham, sausage, hotdogs, corned beef, chipped beef, packaged lunch meats, salt pork, jerky, pickled herring, anchovies, regular canned tuna, sardines, salted nuts. Dairy Processed cheese and cheese spreads. Cheese curds. Blue cheese. Feta cheese. String cheese. Regular cottage cheese. Buttermilk. Canned milk. Fats and oils Salted butter. Regular margarine. Ghee. Bacon fat. Seasonings and other foods Onion salt, garlic salt, seasoned salt, table salt, and sea salt. Canned and packaged gravies.  Worcestershire sauce. Tartar sauce. Barbecue sauce. Teriyaki sauce. Soy sauce, including reduced-sodium. Steak sauce. Fish sauce. Oyster sauce. Cocktail sauce. Horseradish that you find on the shelf. Regular ketchup and mustard. Meat flavorings and tenderizers. Bouillon cubes. Hot sauce and Tabasco sauce. Premade or packaged marinades. Premade or packaged taco seasonings. Relishes. Regular salad dressings. Salsa. Potato and tortilla chips. Corn chips and puffs. Salted popcorn and pretzels. Canned or dried soups. Pizza. Frozen entrees and pot pies. Summary  Eating less sodium can help lower your blood pressure, reduce swelling, and protect your heart, liver, and kidneys.  Most people on this plan should limit their sodium intake to 1,500-2,000 mg (milligrams) of sodium each day.  Canned, boxed, and frozen foods are high in sodium. Restaurant foods, fast foods, and pizza are also very high in sodium. You also get sodium by adding salt to food.  Try to cook at home, eat more fresh fruits and vegetables, and eat less fast food, canned, processed, or prepared foods. This information is not intended to replace advice given to you by your health care provider. Make sure you discuss any questions you have with your health care provider. Document Revised: 09/24/2017 Document Reviewed: 10/05/2016 Elsevier Patient Education  2020 [employer].

## 2020-03-11 DIAGNOSIS — Z8679 Personal history of other diseases of the circulatory system: Secondary | ICD-10-CM | POA: Diagnosis not present

## 2020-03-11 DIAGNOSIS — K219 Gastro-esophageal reflux disease without esophagitis: Secondary | ICD-10-CM | POA: Diagnosis not present

## 2020-03-11 DIAGNOSIS — Z0000 Encounter for general adult medical examination without abnormal findings: Secondary | ICD-10-CM | POA: Diagnosis not present

## 2020-03-11 DIAGNOSIS — E782 Mixed hyperlipidemia: Secondary | ICD-10-CM | POA: Diagnosis not present

## 2020-03-11 DIAGNOSIS — I10 Essential (primary) hypertension: Secondary | ICD-10-CM | POA: Diagnosis not present

## 2020-03-11 DIAGNOSIS — M109 Gout, unspecified: Secondary | ICD-10-CM | POA: Diagnosis not present

## 2020-03-11 DIAGNOSIS — N529 Male erectile dysfunction, unspecified: Secondary | ICD-10-CM | POA: Diagnosis not present

## 2020-03-11 DIAGNOSIS — Z1211 Encounter for screening for malignant neoplasm of colon: Secondary | ICD-10-CM | POA: Diagnosis not present

## 2020-03-11 DIAGNOSIS — R609 Edema, unspecified: Secondary | ICD-10-CM | POA: Diagnosis not present

## 2020-05-23 DIAGNOSIS — N400 Enlarged prostate without lower urinary tract symptoms: Secondary | ICD-10-CM | POA: Diagnosis not present

## 2020-05-23 DIAGNOSIS — E782 Mixed hyperlipidemia: Secondary | ICD-10-CM | POA: Diagnosis not present

## 2020-05-23 DIAGNOSIS — I10 Essential (primary) hypertension: Secondary | ICD-10-CM | POA: Diagnosis not present

## 2020-06-05 DIAGNOSIS — H40013 Open angle with borderline findings, low risk, bilateral: Secondary | ICD-10-CM | POA: Diagnosis not present

## 2020-08-07 DIAGNOSIS — Z23 Encounter for immunization: Secondary | ICD-10-CM | POA: Diagnosis not present

## 2020-08-19 DIAGNOSIS — N400 Enlarged prostate without lower urinary tract symptoms: Secondary | ICD-10-CM | POA: Diagnosis not present

## 2020-08-19 DIAGNOSIS — E782 Mixed hyperlipidemia: Secondary | ICD-10-CM | POA: Diagnosis not present

## 2020-08-19 DIAGNOSIS — I10 Essential (primary) hypertension: Secondary | ICD-10-CM | POA: Diagnosis not present

## 2020-09-09 DIAGNOSIS — M109 Gout, unspecified: Secondary | ICD-10-CM | POA: Diagnosis not present

## 2020-09-09 DIAGNOSIS — K219 Gastro-esophageal reflux disease without esophagitis: Secondary | ICD-10-CM | POA: Diagnosis not present

## 2020-09-09 DIAGNOSIS — E782 Mixed hyperlipidemia: Secondary | ICD-10-CM | POA: Diagnosis not present

## 2020-09-09 DIAGNOSIS — R609 Edema, unspecified: Secondary | ICD-10-CM | POA: Diagnosis not present

## 2020-09-09 DIAGNOSIS — E875 Hyperkalemia: Secondary | ICD-10-CM | POA: Diagnosis not present

## 2020-09-09 DIAGNOSIS — Z8679 Personal history of other diseases of the circulatory system: Secondary | ICD-10-CM | POA: Diagnosis not present

## 2020-09-09 DIAGNOSIS — I10 Essential (primary) hypertension: Secondary | ICD-10-CM | POA: Diagnosis not present

## 2020-09-13 DIAGNOSIS — E875 Hyperkalemia: Secondary | ICD-10-CM | POA: Diagnosis not present

## 2020-11-19 DIAGNOSIS — K219 Gastro-esophageal reflux disease without esophagitis: Secondary | ICD-10-CM | POA: Diagnosis not present

## 2020-11-19 DIAGNOSIS — I10 Essential (primary) hypertension: Secondary | ICD-10-CM | POA: Diagnosis not present

## 2020-11-19 DIAGNOSIS — N400 Enlarged prostate without lower urinary tract symptoms: Secondary | ICD-10-CM | POA: Diagnosis not present

## 2020-11-19 DIAGNOSIS — E782 Mixed hyperlipidemia: Secondary | ICD-10-CM | POA: Diagnosis not present

## 2021-01-10 DIAGNOSIS — I10 Essential (primary) hypertension: Secondary | ICD-10-CM | POA: Diagnosis not present

## 2021-01-10 DIAGNOSIS — K219 Gastro-esophageal reflux disease without esophagitis: Secondary | ICD-10-CM | POA: Diagnosis not present

## 2021-01-10 DIAGNOSIS — N400 Enlarged prostate without lower urinary tract symptoms: Secondary | ICD-10-CM | POA: Diagnosis not present

## 2021-01-10 DIAGNOSIS — E782 Mixed hyperlipidemia: Secondary | ICD-10-CM | POA: Diagnosis not present

## 2021-02-05 DIAGNOSIS — N400 Enlarged prostate without lower urinary tract symptoms: Secondary | ICD-10-CM | POA: Diagnosis not present

## 2021-02-05 DIAGNOSIS — I10 Essential (primary) hypertension: Secondary | ICD-10-CM | POA: Diagnosis not present

## 2021-02-05 DIAGNOSIS — K219 Gastro-esophageal reflux disease without esophagitis: Secondary | ICD-10-CM | POA: Diagnosis not present

## 2021-02-05 DIAGNOSIS — E782 Mixed hyperlipidemia: Secondary | ICD-10-CM | POA: Diagnosis not present

## 2021-03-03 DIAGNOSIS — D229 Melanocytic nevi, unspecified: Secondary | ICD-10-CM | POA: Diagnosis not present

## 2021-03-03 DIAGNOSIS — L905 Scar conditions and fibrosis of skin: Secondary | ICD-10-CM | POA: Diagnosis not present

## 2021-03-03 DIAGNOSIS — I8393 Asymptomatic varicose veins of bilateral lower extremities: Secondary | ICD-10-CM | POA: Diagnosis not present

## 2021-03-03 DIAGNOSIS — L819 Disorder of pigmentation, unspecified: Secondary | ICD-10-CM | POA: Diagnosis not present

## 2021-03-03 DIAGNOSIS — L814 Other melanin hyperpigmentation: Secondary | ICD-10-CM | POA: Diagnosis not present

## 2021-03-03 DIAGNOSIS — L821 Other seborrheic keratosis: Secondary | ICD-10-CM | POA: Diagnosis not present

## 2021-03-03 DIAGNOSIS — L309 Dermatitis, unspecified: Secondary | ICD-10-CM | POA: Diagnosis not present

## 2021-03-03 DIAGNOSIS — L570 Actinic keratosis: Secondary | ICD-10-CM | POA: Diagnosis not present

## 2021-03-03 DIAGNOSIS — D485 Neoplasm of uncertain behavior of skin: Secondary | ICD-10-CM | POA: Diagnosis not present

## 2021-03-11 ENCOUNTER — Other Ambulatory Visit: Payer: Self-pay

## 2021-03-11 ENCOUNTER — Encounter: Payer: Self-pay | Admitting: Cardiology

## 2021-03-11 ENCOUNTER — Ambulatory Visit: Payer: Medicare HMO | Admitting: Cardiology

## 2021-03-11 VITALS — BP 134/82 | HR 76 | Ht 70.0 in | Wt 211.8 lb

## 2021-03-11 DIAGNOSIS — Z8249 Family history of ischemic heart disease and other diseases of the circulatory system: Secondary | ICD-10-CM

## 2021-03-11 DIAGNOSIS — R600 Localized edema: Secondary | ICD-10-CM

## 2021-03-11 DIAGNOSIS — E669 Obesity, unspecified: Secondary | ICD-10-CM

## 2021-03-11 DIAGNOSIS — I10 Essential (primary) hypertension: Secondary | ICD-10-CM | POA: Diagnosis not present

## 2021-03-11 DIAGNOSIS — Z8679 Personal history of other diseases of the circulatory system: Secondary | ICD-10-CM

## 2021-03-11 NOTE — Patient Instructions (Signed)
Medication Instructions:  Your physician recommends that you continue on your current medications as directed. Please refer to the Current Medication list given to you today.  *If you need a refill on your cardiac medications before your next appointment, please call your pharmacy*   Testing/Procedures: Your provider recommends that you have a Calcium Score CT scan.    Follow-Up: At [facility], you and your health needs are our priority.  As part of our continuing mission to provide you with exceptional heart care, we have created designated Provider Care Teams.  These Care Teams include your primary Cardiologist (physician) and Advanced Practice Providers (APPs -  Physician Assistants and Nurse Practitioners) who all work together to provide you with the care you need, when you need it.  Your next appointment:   1 year(s)  The format for your next appointment:   In Person  Provider:   You may see Fransico Him, MD or one of the following Advanced Practice Providers on your designated Care Team:    Melina Copa, PA-C  Ermalinda Barrios, PA-C

## 2021-03-11 NOTE — Progress Notes (Signed)
Date:  03/11/2021   ID:  Jacob Mcclain, DOB 18-Nov-1945, MRN [id number]  PCP:  Antony Contras, MD  Cardiologist:  Fransico Him, MD Electrophysiologist:  None   Chief Complaint:  Pericarditis, HTN and leg edema  History of Present Illness:    Jacob Mcclain is a 75 y.o. male  with a hx of Pericarditis and HTN. He is here today for followup of HTN and edema and is doing well.  He denies any chest pain or pressure, SOB, DOE, PND, orthopnea,  dizziness, palpitations or syncope. He has chronic ankle edema that is stable.  He is compliant with his meds and is tolerating meds with no SE.  He tells me that his brother just had CABG and valve replacement.  Prior CV studies:   The following studies were reviewed today  EKG  Past Medical History:  Diagnosis Date  . Abnormal EKG 11/19/2016   LVH with repolarization abnormality  . Ankylosing spondylitis ([place])   . Diastolic dysfunction   . GERD (gastroesophageal reflux disease)   . Gout    Last flare 7/09  . History of hemorrhoids   . History of squamous cell carcinoma    Followed by Dr Allyson Sabal  . Hypercholesterolemia   . Hypertension   . Insomnia   . Pericarditis    No past surgical history on file.   Current Meds  Medication Sig  . amLODipine (NORVASC) 10 MG tablet TAKE 1 TABLET EVERY DAY  . atorvastatin (LIPITOR) 20 MG tablet Take 20 mg by mouth daily.  . Multiple Vitamins-Minerals (PRESERVISION AREDS 2 PO) Take 1 tablet by mouth in the morning and at bedtime.  . multivitamin (ONE-A-DAY MEN'S) TABS tablet Take 1 tablet by mouth daily.  . nebivolol (BYSTOLIC) 5 MG tablet Take 5 mg by mouth daily.  . Omega-3 Fatty Acids (FISH OIL) 1000 MG CAPS Take 1 capsule by mouth daily.   Marland Kitchen triamcinolone ointment (KENALOG) 0.1 % SMARTSIG:1 Sparingly Topical Twice Daily PRN     Allergies:   Patient has no known allergies.   Social History   Tobacco Use  . Smoking status: Former Smoker    Types: Cigarettes  . Smokeless tobacco: Never Used   Vaping Use  . Vaping Use: Never used  Substance Use Topics  . Alcohol use: Yes    Alcohol/week: 7.0 standard drinks    Types: 7 Glasses of wine per week    Comment: 2-3 beers weekly  . Drug use: No     Family Hx: The patient's family history includes Lung cancer in his father.  ROS:   Please see the history of present illness.     All other systems reviewed and are negative.   Labs/Other Tests and Data Reviewed:    Recent Labs: No results found for requested labs within last 8760 hours.   Recent Lipid Panel No results found for: CHOL, TRIG, HDL, CHOLHDL, LDLCALC, LDLDIRECT  Wt Readings from Last 3 Encounters:  03/11/21 211 lb 12.8 oz (96.1 kg)  02/22/20 216 lb 3.2 oz (98.1 kg)  02/20/19 207 lb (93.9 kg)    EKG was performed and personally reviewed and interpreted showing NSR with no ST changes  Objective:    Vital Signs:  BP 134/82   Pulse 76   Ht 5\' 10"  (1.778 m)   Wt 211 lb 12.8 oz (96.1 kg)   BMI 30.39 kg/m    GEN: Well nourished, well developed in no acute distress HEENT: Normal NECK: No JVD; No carotid  bruits LYMPHATICS: No lymphadenopathy CARDIAC:RRR, no murmurs, rubs, gallops RESPIRATORY:  Clear to auscultation without rales, wheezing or rhonchi  ABDOMEN: Soft, non-tender, non-distended MUSCULOSKELETAL:  No edema; No deformity  SKIN: Warm and dry NEUROLOGIC:  Alert and oriented x 3 PSYCHIATRIC:  Normal affect    ASSESSMENT & PLAN:    1.  History of Pericarditis  -no reoccurrence of pericarditis or chest pain  2.  HTN  -his BP is controlled on exam today -he will continue on prescription drug management with amlodipine 10mg  daily and Bystolic 5mg  daily -I encouraged to avoid using added salt in his diet and use salt substitute  3.  Obesity -I have encouraged him to get into a routine exercise program and cut back on carbs and portions.   4.  LE edema -this is chronic and related to dietary indiscretion with Na intake -again stressed the  importance of following a strict < 2gm Na diet  5.  Fm hx of CAD -this was late in age but he does have other CRFs including HTN and HLD -I will get a coronary Ca score to assess future cardiac risk   Medication Adjustments/Labs and Tests Ordered: Current medicines are reviewed at length with the patient today.  Concerns regarding medicines are outlined above.  Tests Ordered: Orders Placed This Encounter  Procedures  . EKG 12-Lead   Medication Changes: No orders of the defined types were placed in this encounter.   Disposition:  Follow up in 1 year(s)  Signed, Fransico Him, MD  03/11/2021 10:07 AM    [facility]

## 2021-03-11 NOTE — Addendum Note (Signed)
Addended by: Antonieta Iba on: 03/11/2021 10:11 AM   Modules accepted: Orders

## 2021-03-25 DIAGNOSIS — Z1211 Encounter for screening for malignant neoplasm of colon: Secondary | ICD-10-CM | POA: Diagnosis not present

## 2021-03-25 DIAGNOSIS — Z1389 Encounter for screening for other disorder: Secondary | ICD-10-CM | POA: Diagnosis not present

## 2021-03-25 DIAGNOSIS — R609 Edema, unspecified: Secondary | ICD-10-CM | POA: Diagnosis not present

## 2021-03-25 DIAGNOSIS — M109 Gout, unspecified: Secondary | ICD-10-CM | POA: Diagnosis not present

## 2021-03-25 DIAGNOSIS — E782 Mixed hyperlipidemia: Secondary | ICD-10-CM | POA: Diagnosis not present

## 2021-03-25 DIAGNOSIS — Z0000 Encounter for general adult medical examination without abnormal findings: Secondary | ICD-10-CM | POA: Diagnosis not present

## 2021-03-25 DIAGNOSIS — K219 Gastro-esophageal reflux disease without esophagitis: Secondary | ICD-10-CM | POA: Diagnosis not present

## 2021-03-25 DIAGNOSIS — I10 Essential (primary) hypertension: Secondary | ICD-10-CM | POA: Diagnosis not present

## 2021-03-25 DIAGNOSIS — Z8679 Personal history of other diseases of the circulatory system: Secondary | ICD-10-CM | POA: Diagnosis not present

## 2021-03-26 DIAGNOSIS — H524 Presbyopia: Secondary | ICD-10-CM | POA: Diagnosis not present

## 2021-03-26 DIAGNOSIS — H40013 Open angle with borderline findings, low risk, bilateral: Secondary | ICD-10-CM | POA: Diagnosis not present

## 2021-03-26 DIAGNOSIS — H353131 Nonexudative age-related macular degeneration, bilateral, early dry stage: Secondary | ICD-10-CM | POA: Diagnosis not present

## 2021-04-09 DIAGNOSIS — Z0100 Encounter for examination of eyes and vision without abnormal findings: Secondary | ICD-10-CM | POA: Diagnosis not present

## 2021-04-10 ENCOUNTER — Ambulatory Visit (INDEPENDENT_AMBULATORY_CARE_PROVIDER_SITE_OTHER)
Admission: RE | Admit: 2021-04-10 | Discharge: 2021-04-10 | Disposition: A | Discharge status: Home / Self Care | Payer: Self-pay | Source: Ambulatory Visit | Attending: Cardiology | Admitting: Cardiology

## 2021-04-10 ENCOUNTER — Other Ambulatory Visit: Payer: Self-pay

## 2021-04-10 ENCOUNTER — Encounter: Payer: Self-pay | Admitting: Cardiology

## 2021-04-10 DIAGNOSIS — I700 Atherosclerosis of aorta: Secondary | ICD-10-CM | POA: Insufficient documentation

## 2021-04-10 DIAGNOSIS — Z8679 Personal history of other diseases of the circulatory system: Secondary | ICD-10-CM

## 2021-04-11 ENCOUNTER — Encounter: Payer: Self-pay | Admitting: Cardiology

## 2021-04-11 DIAGNOSIS — R931 Abnormal findings on diagnostic imaging of heart and coronary circulation: Secondary | ICD-10-CM | POA: Insufficient documentation

## 2021-04-15 ENCOUNTER — Telehealth: Payer: Self-pay

## 2021-04-15 DIAGNOSIS — R931 Abnormal findings on diagnostic imaging of heart and coronary circulation: Secondary | ICD-10-CM

## 2021-04-15 DIAGNOSIS — Z8249 Family history of ischemic heart disease and other diseases of the circulatory system: Secondary | ICD-10-CM

## 2021-04-15 NOTE — Telephone Encounter (Signed)
The patient has been notified of the result and verbalized understanding.  All questions (if any) were answered. Antonieta Iba, RN 04/15/2021 3:29 PM  Leane Call has been ordered.

## 2021-04-15 NOTE — Telephone Encounter (Signed)
-----   Message from Sueanne Margarita, MD sent at 04/11/2021  9:42 AM EDT ----- Patient has high coronary Ca score at 778 which is 73rd% for his age and sex matched controls..  Please get a copy of last FLP and ALT from [facility] and set up Lexiscan myoview to assess for ischemia

## 2021-04-23 DIAGNOSIS — N400 Enlarged prostate without lower urinary tract symptoms: Secondary | ICD-10-CM | POA: Diagnosis not present

## 2021-04-23 DIAGNOSIS — E782 Mixed hyperlipidemia: Secondary | ICD-10-CM | POA: Diagnosis not present

## 2021-04-23 DIAGNOSIS — K219 Gastro-esophageal reflux disease without esophagitis: Secondary | ICD-10-CM | POA: Diagnosis not present

## 2021-04-23 DIAGNOSIS — I10 Essential (primary) hypertension: Secondary | ICD-10-CM | POA: Diagnosis not present

## 2021-05-05 ENCOUNTER — Encounter (HOSPITAL_COMMUNITY): Payer: Medicare HMO

## 2021-05-13 ENCOUNTER — Telehealth (HOSPITAL_COMMUNITY): Payer: Self-pay | Admitting: *Deleted

## 2021-05-13 NOTE — Telephone Encounter (Signed)
Patient given detailed instructions per Myocardial Perfusion Study Information Sheet for the test on 05/19/21 at 1045. Patient notified to arrive 15 minutes early and that it is imperative to arrive on time for appointment to keep from having the test rescheduled.  If you need to cancel or reschedule your appointment, please call the office within 24 hours of your appointment. . Patient verbalized understanding.Jacob Mcclain, [name] Patient did not want Press photographer

## 2021-05-19 ENCOUNTER — Other Ambulatory Visit: Payer: Self-pay

## 2021-05-19 ENCOUNTER — Ambulatory Visit (HOSPITAL_COMMUNITY): Payer: Medicare HMO | Attending: Internal Medicine

## 2021-05-19 DIAGNOSIS — I10 Essential (primary) hypertension: Secondary | ICD-10-CM | POA: Diagnosis not present

## 2021-05-19 DIAGNOSIS — I2510 Atherosclerotic heart disease of native coronary artery without angina pectoris: Secondary | ICD-10-CM | POA: Insufficient documentation

## 2021-05-19 DIAGNOSIS — R931 Abnormal findings on diagnostic imaging of heart and coronary circulation: Secondary | ICD-10-CM

## 2021-05-19 DIAGNOSIS — Z136 Encounter for screening for cardiovascular disorders: Secondary | ICD-10-CM | POA: Insufficient documentation

## 2021-05-19 DIAGNOSIS — Z8249 Family history of ischemic heart disease and other diseases of the circulatory system: Secondary | ICD-10-CM

## 2021-05-19 LAB — MYOCARDIAL PERFUSION IMAGING
LV dias vol: 82 mL (ref 62–150)
LV sys vol: 30 mL
Peak HR: 93 {beats}/min
Rest HR: 69 {beats}/min
SDS: 1
SRS: 0
SSS: 1
TID: 0.98

## 2021-05-19 MED ORDER — TECHNETIUM TC 99M TETROFOSMIN IV KIT
10.5000 | PACK | Freq: Once | INTRAVENOUS | Status: AC | PRN
Start: 2021-05-19 — End: 2021-05-19
  Administered 2021-05-19: 10.5 via INTRAVENOUS
  Filled 2021-05-19: qty 11

## 2021-05-19 MED ORDER — TECHNETIUM TC 99M TETROFOSMIN IV KIT
31.5000 | PACK | Freq: Once | INTRAVENOUS | Status: AC | PRN
  Administered 2021-05-19: 31.5 via INTRAVENOUS
  Filled 2021-05-19: qty 32

## 2021-05-19 MED ORDER — REGADENOSON 0.4 MG/5ML IV SOLN
0.4000 mg | Freq: Once | INTRAVENOUS | Status: AC
  Administered 2021-05-19: 0.4 mg via INTRAVENOUS

## 2021-05-28 DIAGNOSIS — E782 Mixed hyperlipidemia: Secondary | ICD-10-CM | POA: Diagnosis not present

## 2021-05-28 DIAGNOSIS — K219 Gastro-esophageal reflux disease without esophagitis: Secondary | ICD-10-CM | POA: Diagnosis not present

## 2021-05-28 DIAGNOSIS — I10 Essential (primary) hypertension: Secondary | ICD-10-CM | POA: Diagnosis not present

## 2021-05-28 DIAGNOSIS — N400 Enlarged prostate without lower urinary tract symptoms: Secondary | ICD-10-CM | POA: Diagnosis not present

## 2021-07-31 DIAGNOSIS — K219 Gastro-esophageal reflux disease without esophagitis: Secondary | ICD-10-CM | POA: Diagnosis not present

## 2021-07-31 DIAGNOSIS — I10 Essential (primary) hypertension: Secondary | ICD-10-CM | POA: Diagnosis not present

## 2021-07-31 DIAGNOSIS — E782 Mixed hyperlipidemia: Secondary | ICD-10-CM | POA: Diagnosis not present

## 2021-07-31 DIAGNOSIS — N400 Enlarged prostate without lower urinary tract symptoms: Secondary | ICD-10-CM | POA: Diagnosis not present

## 2021-08-12 DIAGNOSIS — Z23 Encounter for immunization: Secondary | ICD-10-CM | POA: Diagnosis not present

## 2021-08-12 DIAGNOSIS — H353131 Nonexudative age-related macular degeneration, bilateral, early dry stage: Secondary | ICD-10-CM | POA: Diagnosis not present

## 2021-09-22 DIAGNOSIS — Z136 Encounter for screening for cardiovascular disorders: Secondary | ICD-10-CM | POA: Diagnosis not present

## 2021-09-22 DIAGNOSIS — M47812 Spondylosis without myelopathy or radiculopathy, cervical region: Secondary | ICD-10-CM | POA: Diagnosis not present

## 2021-09-22 DIAGNOSIS — I77811 Abdominal aortic ectasia: Secondary | ICD-10-CM | POA: Diagnosis not present

## 2021-09-22 DIAGNOSIS — E669 Obesity, unspecified: Secondary | ICD-10-CM | POA: Diagnosis not present

## 2021-09-22 DIAGNOSIS — M1611 Unilateral primary osteoarthritis, right hip: Secondary | ICD-10-CM | POA: Diagnosis not present

## 2021-09-22 DIAGNOSIS — I10 Essential (primary) hypertension: Secondary | ICD-10-CM | POA: Diagnosis not present

## 2021-09-22 DIAGNOSIS — Z6830 Body mass index (BMI) 30.0-30.9, adult: Secondary | ICD-10-CM | POA: Diagnosis not present

## 2021-09-22 DIAGNOSIS — E785 Hyperlipidemia, unspecified: Secondary | ICD-10-CM | POA: Diagnosis not present

## 2021-09-22 DIAGNOSIS — G629 Polyneuropathy, unspecified: Secondary | ICD-10-CM | POA: Diagnosis not present

## 2021-09-30 DIAGNOSIS — M109 Gout, unspecified: Secondary | ICD-10-CM | POA: Diagnosis not present

## 2021-09-30 DIAGNOSIS — R609 Edema, unspecified: Secondary | ICD-10-CM | POA: Diagnosis not present

## 2021-09-30 DIAGNOSIS — K219 Gastro-esophageal reflux disease without esophagitis: Secondary | ICD-10-CM | POA: Diagnosis not present

## 2021-09-30 DIAGNOSIS — E782 Mixed hyperlipidemia: Secondary | ICD-10-CM | POA: Diagnosis not present

## 2021-09-30 DIAGNOSIS — I10 Essential (primary) hypertension: Secondary | ICD-10-CM | POA: Diagnosis not present

## 2021-09-30 DIAGNOSIS — Z8679 Personal history of other diseases of the circulatory system: Secondary | ICD-10-CM | POA: Diagnosis not present

## 2022-01-12 DIAGNOSIS — I10 Essential (primary) hypertension: Secondary | ICD-10-CM | POA: Diagnosis not present

## 2022-01-12 DIAGNOSIS — M1990 Unspecified osteoarthritis, unspecified site: Secondary | ICD-10-CM | POA: Diagnosis not present

## 2022-01-12 DIAGNOSIS — Z8679 Personal history of other diseases of the circulatory system: Secondary | ICD-10-CM | POA: Diagnosis not present

## 2022-01-12 DIAGNOSIS — Z86010 Personal history of colonic polyps: Secondary | ICD-10-CM | POA: Diagnosis not present

## 2022-01-12 DIAGNOSIS — E7800 Pure hypercholesterolemia, unspecified: Secondary | ICD-10-CM | POA: Diagnosis not present

## 2022-02-05 IMAGING — CT CT CARDIAC CORONARY ARTERY CALCIUM SCORE
3 series · 14 of 20 positions shown, 15 images · non-contrast
Comparison: None.
COMPARISON: None.

Addendum:
EXAM:
OVER-READ INTERPRETATION  CT CHEST

The following report is an over-read performed by radiologist Dr.
Anowai Rae [REDACTED] on 04/10/2021. This
over-read does not include interpretation of cardiac or coronary
anatomy or pathology. The coronary calcium score interpretation by
the cardiologist is attached.
CLINICAL DATA: Cardiovascular Disease Risk stratification
Coronary Calcium Score
TECHNIQUE: A gated, non-contrast computed tomography scan of the heart was
performed using 3mm slice thickness. Axial images were analyzed on a
dedicated workstation. Calcium scoring of the coronary arteries was
performed using the Agatston method.

[Series 2: casc 3.0 bv41 2 bestsyst 32 % · axial · 0.47mm/px · z∈[-225,-135]mm · 4 of 51 slices shown, 5 images]
[im 11/51  vessel]
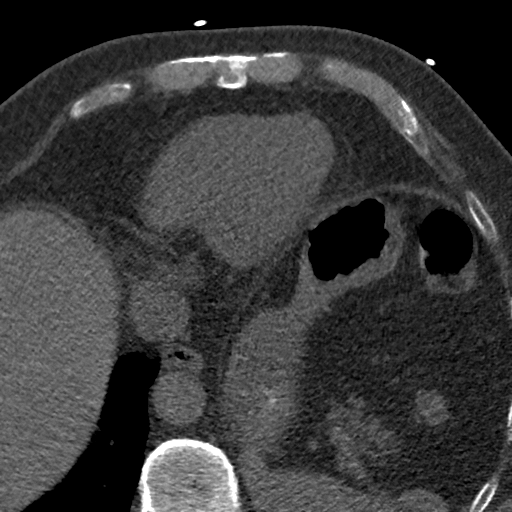
[im 11/51  lung]
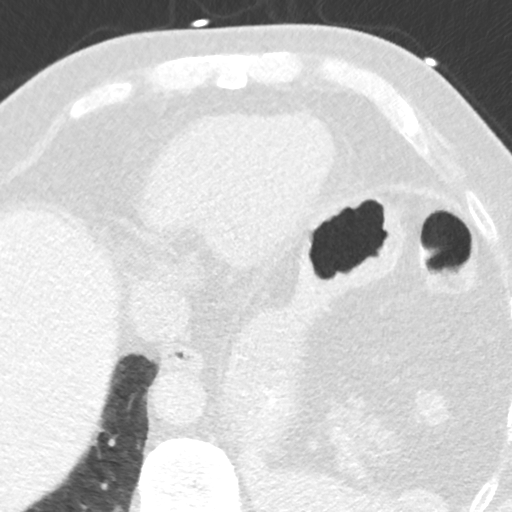
[im 21/51  vessel]
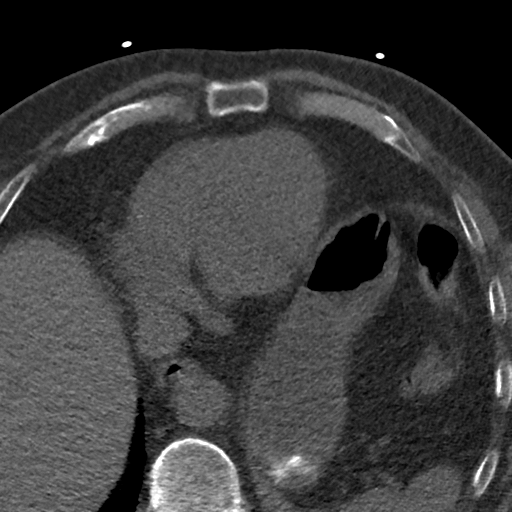
[im 31/51  vessel]
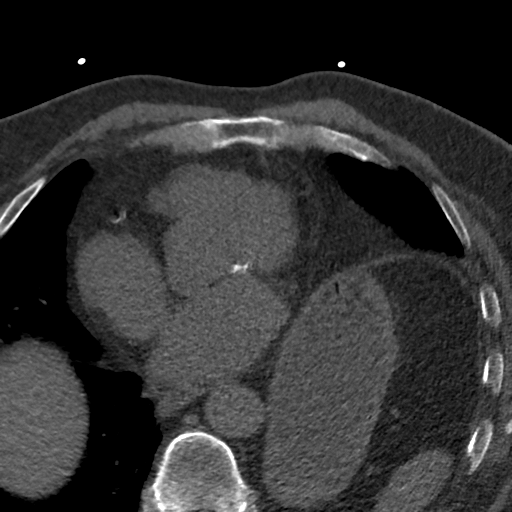
[im 41/51  vessel]
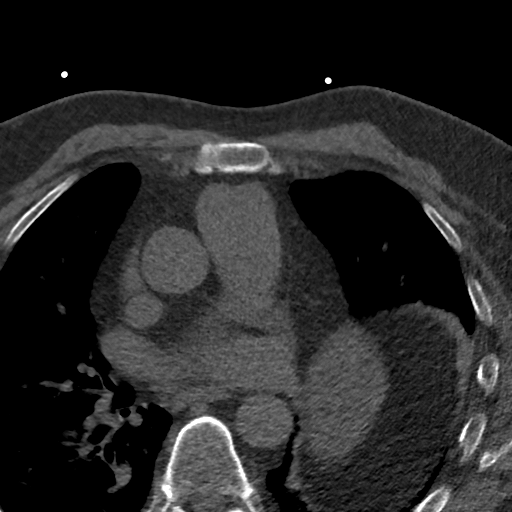

[Series 3: lung 32 % · axial · 0.68mm/px · z∈[-231,-132]mm · 5 of 51 slices shown]
[im 9/51  lung]
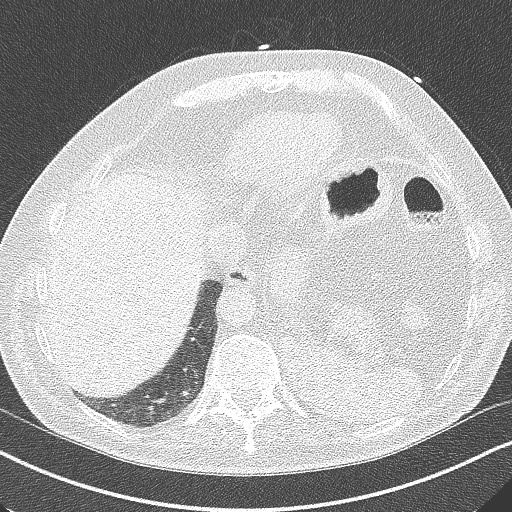
[im 17/51  lung]
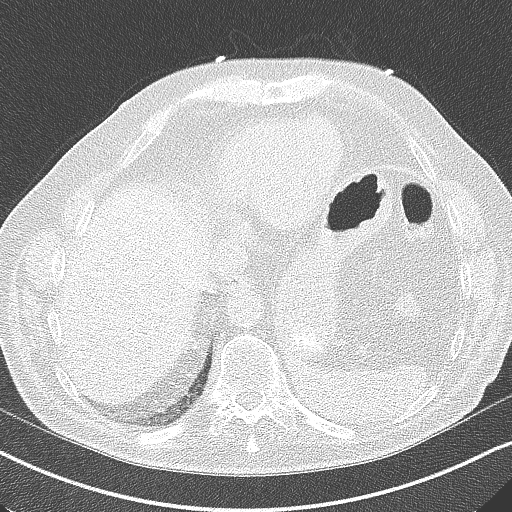
[im 26/51  lung]
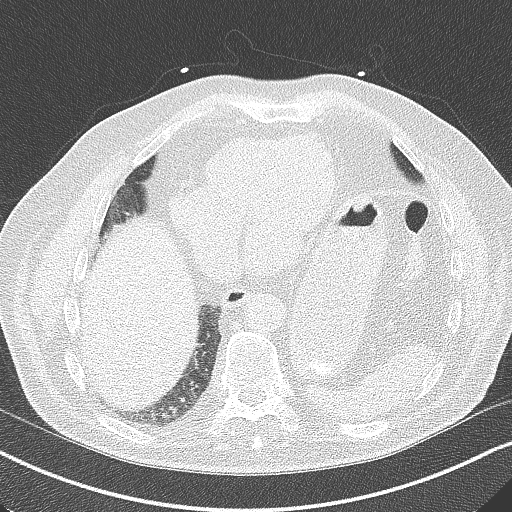
[im 34/51  lung]
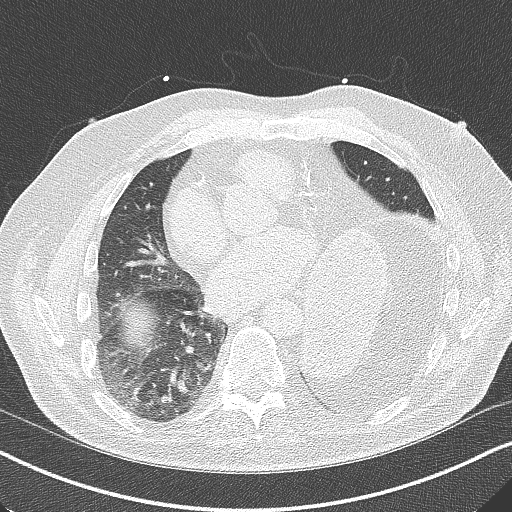
[im 42/51  lung]
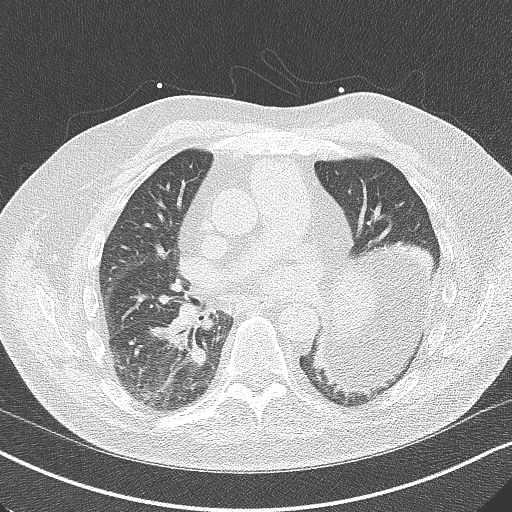

[Series 4: lung st 32 % · axial · 0.68mm/px · z∈[-231,-132]mm · 5 of 51 slices shown]
[im 9/51  lung]
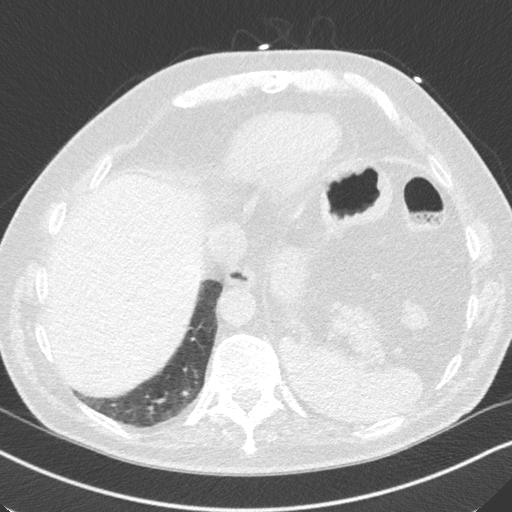
[im 17/51  lung]
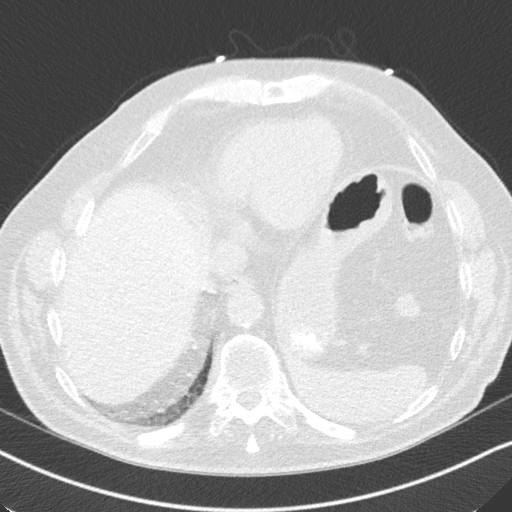
[im 26/51  lung]
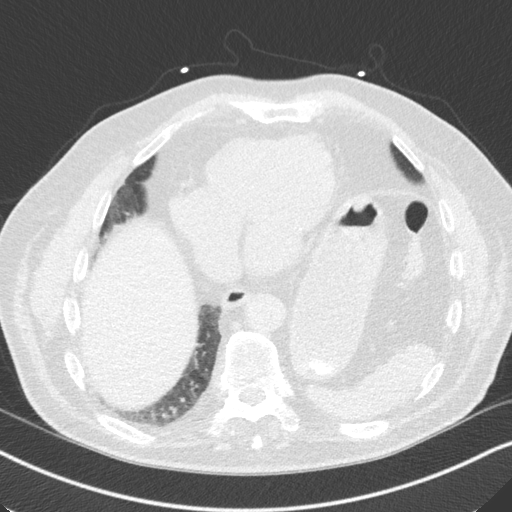
[im 34/51  lung]
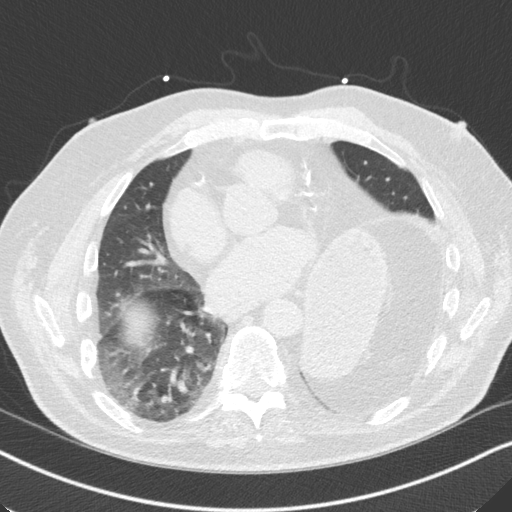
[im 42/51  lung]
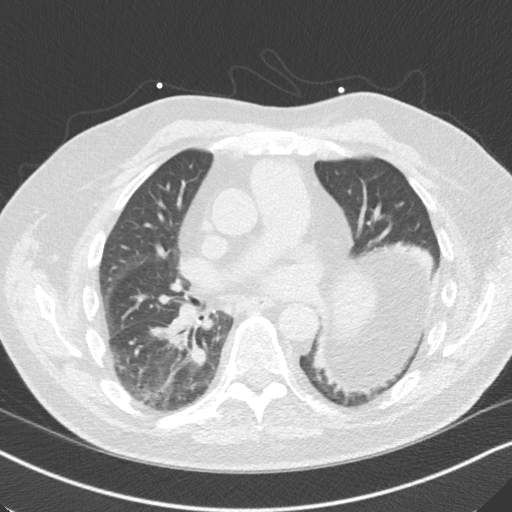

[14 of 20 positions shown; findings below may reference images not displayed]

FINDINGS: Elevation of the left hemidiaphragm. Atherosclerotic calcifications
in the thoracic aorta. Small calcified granuloma in the left lower
lobe. Within the visualized portions of the thorax there are no
other definite suspicious appearing pulmonary nodules or masses,
there is no acute consolidative airspace disease, no pleural
effusions, no pneumothorax and no lymphadenopathy. Visualized
portions of the upper abdomen are unremarkable. There are no
aggressive appearing lytic or blastic lesions noted in the
visualized portions of the skeleton.
IMPRESSION: 1.  Aortic Atherosclerosis (IWSJP-G6G.G).
FINDINGS: Coronary arteries: Normal origins.

Coronary Calcium Score:

Left main: 0

Left anterior descending artery: 584

Left circumflex artery: 0

Right coronary artery: 194

Total: 778

Percentile: 73

Pericardium: Normal.

Ascending Aorta: Normal caliber.

Non-cardiac: See separate report from [REDACTED].
IMPRESSION: Coronary calcium score of 778 Agatston units. This was 73rd
percentile for age-, race-, and sex-matched controls.



If CAC=0, it is reasonable to withhold statin therapy and reassess
in 5 to 10 years, as long as higher risk conditions are absent
(diabetes mellitus, family history of premature CHD in first degree
relatives (males <55 years; females <65 years), cigarette smoking,
or LDL >=190 mg/dL).

If CAC is 1 to 99, it is reasonable to initiate statin therapy for
patients >=55 years of age.

If CAC is >=100 or >=75th percentile, it is reasonable to initiate
statin therapy at any age.

Cardiology referral should be considered for patients with CAC
scores >=400 or >=75th percentile.

*2549 AHA/ACC/AACVPR/AAPA/ABC/DEE/MYOUNGRO/AHMED REDA/Kefayet/HENRIQUE MONTEIRO/SHALLEY/TL
Guideline on the Management of Blood Cholesterol: A Report of the
American College of Cardiology/American Heart Association Task Force
on Clinical Practice Guidelines. J Am Coll Cardiol.
8266;73(24):6422-6552.

*** End of Addendum ***
EXAM:
OVER-READ INTERPRETATION  CT CHEST

The following report is an over-read performed by radiologist Dr.
Anowai Rae [REDACTED] on 04/10/2021. This
over-read does not include interpretation of cardiac or coronary
anatomy or pathology. The coronary calcium score interpretation by
the cardiologist is attached.
FINDINGS: Elevation of the left hemidiaphragm. Atherosclerotic calcifications
in the thoracic aorta. Small calcified granuloma in the left lower
lobe. Within the visualized portions of the thorax there are no
other definite suspicious appearing pulmonary nodules or masses,
there is no acute consolidative airspace disease, no pleural
effusions, no pneumothorax and no lymphadenopathy. Visualized
portions of the upper abdomen are unremarkable. There are no
aggressive appearing lytic or blastic lesions noted in the
visualized portions of the skeleton.
IMPRESSION: 1.  Aortic Atherosclerosis (IWSJP-G6G.G).

## 2022-02-24 DIAGNOSIS — L570 Actinic keratosis: Secondary | ICD-10-CM | POA: Diagnosis not present

## 2022-02-24 DIAGNOSIS — L821 Other seborrheic keratosis: Secondary | ICD-10-CM | POA: Diagnosis not present

## 2022-02-24 DIAGNOSIS — L300 Nummular dermatitis: Secondary | ICD-10-CM | POA: Diagnosis not present

## 2022-04-13 DIAGNOSIS — Z0000 Encounter for general adult medical examination without abnormal findings: Secondary | ICD-10-CM | POA: Diagnosis not present

## 2022-04-13 DIAGNOSIS — Z79899 Other long term (current) drug therapy: Secondary | ICD-10-CM | POA: Diagnosis not present

## 2022-04-20 DIAGNOSIS — Z1331 Encounter for screening for depression: Secondary | ICD-10-CM | POA: Diagnosis not present

## 2022-04-20 DIAGNOSIS — I10 Essential (primary) hypertension: Secondary | ICD-10-CM | POA: Diagnosis not present

## 2022-04-20 DIAGNOSIS — Z0000 Encounter for general adult medical examination without abnormal findings: Secondary | ICD-10-CM | POA: Diagnosis not present

## 2022-04-20 DIAGNOSIS — E7800 Pure hypercholesterolemia, unspecified: Secondary | ICD-10-CM | POA: Diagnosis not present

## 2022-04-20 DIAGNOSIS — D126 Benign neoplasm of colon, unspecified: Secondary | ICD-10-CM | POA: Diagnosis not present

## 2022-07-01 DIAGNOSIS — D72829 Elevated white blood cell count, unspecified: Secondary | ICD-10-CM | POA: Diagnosis not present

## 2022-07-01 DIAGNOSIS — J9811 Atelectasis: Secondary | ICD-10-CM | POA: Diagnosis not present

## 2022-07-01 DIAGNOSIS — R0902 Hypoxemia: Secondary | ICD-10-CM | POA: Diagnosis not present

## 2022-07-01 DIAGNOSIS — I82492 Acute embolism and thrombosis of other specified deep vein of left lower extremity: Secondary | ICD-10-CM | POA: Diagnosis not present

## 2022-07-01 DIAGNOSIS — Z79899 Other long term (current) drug therapy: Secondary | ICD-10-CM | POA: Diagnosis not present

## 2022-07-01 DIAGNOSIS — Z20822 Contact with and (suspected) exposure to covid-19: Secondary | ICD-10-CM | POA: Diagnosis not present

## 2022-07-01 DIAGNOSIS — I2699 Other pulmonary embolism without acute cor pulmonale: Secondary | ICD-10-CM | POA: Diagnosis not present

## 2022-07-01 DIAGNOSIS — I2692 Saddle embolus of pulmonary artery without acute cor pulmonale: Secondary | ICD-10-CM | POA: Diagnosis not present

## 2022-07-01 DIAGNOSIS — R5383 Other fatigue: Secondary | ICD-10-CM | POA: Diagnosis not present

## 2022-07-01 DIAGNOSIS — Z87891 Personal history of nicotine dependence: Secondary | ICD-10-CM | POA: Diagnosis not present

## 2022-07-01 DIAGNOSIS — E785 Hyperlipidemia, unspecified: Secondary | ICD-10-CM | POA: Diagnosis not present

## 2022-07-01 DIAGNOSIS — J9601 Acute respiratory failure with hypoxia: Secondary | ICD-10-CM | POA: Diagnosis not present

## 2022-07-01 DIAGNOSIS — R0602 Shortness of breath: Secondary | ICD-10-CM | POA: Diagnosis not present

## 2022-07-01 DIAGNOSIS — I10 Essential (primary) hypertension: Secondary | ICD-10-CM | POA: Diagnosis not present

## 2022-07-01 DIAGNOSIS — R079 Chest pain, unspecified: Secondary | ICD-10-CM | POA: Diagnosis not present

## 2022-07-01 DIAGNOSIS — Z743 Need for continuous supervision: Secondary | ICD-10-CM | POA: Diagnosis not present

## 2022-07-01 DIAGNOSIS — I82452 Acute embolism and thrombosis of left peroneal vein: Secondary | ICD-10-CM | POA: Diagnosis not present

## 2022-07-02 DIAGNOSIS — R5383 Other fatigue: Secondary | ICD-10-CM | POA: Diagnosis not present

## 2022-07-02 DIAGNOSIS — R0602 Shortness of breath: Secondary | ICD-10-CM | POA: Diagnosis not present

## 2022-07-02 DIAGNOSIS — R079 Chest pain, unspecified: Secondary | ICD-10-CM | POA: Diagnosis not present

## 2022-07-03 ENCOUNTER — Telehealth: Payer: Self-pay | Admitting: Cardiology

## 2022-07-03 NOTE — Telephone Encounter (Signed)
Called patient from recall list, he stated he moved.

## 2022-07-09 DIAGNOSIS — I2699 Other pulmonary embolism without acute cor pulmonale: Secondary | ICD-10-CM | POA: Diagnosis not present

## 2022-07-13 DIAGNOSIS — H35372 Puckering of macula, left eye: Secondary | ICD-10-CM | POA: Diagnosis not present

## 2022-07-16 DIAGNOSIS — I2699 Other pulmonary embolism without acute cor pulmonale: Secondary | ICD-10-CM | POA: Diagnosis not present

## 2022-07-30 DIAGNOSIS — I82402 Acute embolism and thrombosis of unspecified deep veins of left lower extremity: Secondary | ICD-10-CM | POA: Diagnosis not present

## 2022-07-30 DIAGNOSIS — I2699 Other pulmonary embolism without acute cor pulmonale: Secondary | ICD-10-CM | POA: Diagnosis not present

## 2022-07-30 DIAGNOSIS — Z6830 Body mass index (BMI) 30.0-30.9, adult: Secondary | ICD-10-CM | POA: Diagnosis not present

## 2022-08-07 DIAGNOSIS — Z23 Encounter for immunization: Secondary | ICD-10-CM | POA: Diagnosis not present

## 2022-10-08 DIAGNOSIS — I10 Essential (primary) hypertension: Secondary | ICD-10-CM | POA: Diagnosis not present

## 2022-10-08 DIAGNOSIS — E785 Hyperlipidemia, unspecified: Secondary | ICD-10-CM | POA: Diagnosis not present

## 2022-10-08 DIAGNOSIS — I82452 Acute embolism and thrombosis of left peroneal vein: Secondary | ICD-10-CM | POA: Diagnosis not present

## 2022-10-08 DIAGNOSIS — I2699 Other pulmonary embolism without acute cor pulmonale: Secondary | ICD-10-CM | POA: Diagnosis not present

## 2022-10-30 DIAGNOSIS — I82409 Acute embolism and thrombosis of unspecified deep veins of unspecified lower extremity: Secondary | ICD-10-CM | POA: Diagnosis not present

## 2022-11-05 DIAGNOSIS — I10 Essential (primary) hypertension: Secondary | ICD-10-CM | POA: Diagnosis not present

## 2022-11-05 DIAGNOSIS — I2699 Other pulmonary embolism without acute cor pulmonale: Secondary | ICD-10-CM | POA: Diagnosis not present

## 2022-11-05 DIAGNOSIS — Z6831 Body mass index (BMI) 31.0-31.9, adult: Secondary | ICD-10-CM | POA: Diagnosis not present

## 2022-11-05 DIAGNOSIS — I82402 Acute embolism and thrombosis of unspecified deep veins of left lower extremity: Secondary | ICD-10-CM | POA: Diagnosis not present

## 2022-11-05 DIAGNOSIS — E785 Hyperlipidemia, unspecified: Secondary | ICD-10-CM | POA: Diagnosis not present

## 2023-02-25 DIAGNOSIS — L300 Nummular dermatitis: Secondary | ICD-10-CM | POA: Diagnosis not present

## 2023-02-25 DIAGNOSIS — L821 Other seborrheic keratosis: Secondary | ICD-10-CM | POA: Diagnosis not present

## 2023-02-25 DIAGNOSIS — Z1283 Encounter for screening for malignant neoplasm of skin: Secondary | ICD-10-CM | POA: Diagnosis not present

## 2023-02-25 DIAGNOSIS — D229 Melanocytic nevi, unspecified: Secondary | ICD-10-CM | POA: Diagnosis not present

## 2023-05-12 DIAGNOSIS — Z86711 Personal history of pulmonary embolism: Secondary | ICD-10-CM | POA: Diagnosis not present

## 2023-05-12 DIAGNOSIS — E782 Mixed hyperlipidemia: Secondary | ICD-10-CM | POA: Diagnosis not present

## 2023-05-12 DIAGNOSIS — Z1331 Encounter for screening for depression: Secondary | ICD-10-CM | POA: Diagnosis not present

## 2023-05-12 DIAGNOSIS — I10 Essential (primary) hypertension: Secondary | ICD-10-CM | POA: Diagnosis not present

## 2023-05-12 DIAGNOSIS — Z1211 Encounter for screening for malignant neoplasm of colon: Secondary | ICD-10-CM | POA: Diagnosis not present

## 2023-05-12 DIAGNOSIS — Z0000 Encounter for general adult medical examination without abnormal findings: Secondary | ICD-10-CM | POA: Diagnosis not present

## 2023-05-12 DIAGNOSIS — Z86718 Personal history of other venous thrombosis and embolism: Secondary | ICD-10-CM | POA: Diagnosis not present

## 2023-05-12 DIAGNOSIS — Z125 Encounter for screening for malignant neoplasm of prostate: Secondary | ICD-10-CM | POA: Diagnosis not present

## 2023-05-12 DIAGNOSIS — Z86010 Personal history of colonic polyps: Secondary | ICD-10-CM | POA: Diagnosis not present

## 2023-07-22 DIAGNOSIS — I10 Essential (primary) hypertension: Secondary | ICD-10-CM | POA: Diagnosis not present

## 2023-07-22 DIAGNOSIS — Z1211 Encounter for screening for malignant neoplasm of colon: Secondary | ICD-10-CM | POA: Diagnosis not present

## 2023-07-22 DIAGNOSIS — E785 Hyperlipidemia, unspecified: Secondary | ICD-10-CM | POA: Diagnosis not present

## 2023-07-22 DIAGNOSIS — D122 Benign neoplasm of ascending colon: Secondary | ICD-10-CM | POA: Diagnosis not present

## 2023-07-22 DIAGNOSIS — D125 Benign neoplasm of sigmoid colon: Secondary | ICD-10-CM | POA: Diagnosis not present

## 2023-07-22 DIAGNOSIS — K5730 Diverticulosis of large intestine without perforation or abscess without bleeding: Secondary | ICD-10-CM | POA: Diagnosis not present

## 2023-07-28 DIAGNOSIS — H2513 Age-related nuclear cataract, bilateral: Secondary | ICD-10-CM | POA: Diagnosis not present

## 2023-07-28 DIAGNOSIS — H43391 Other vitreous opacities, right eye: Secondary | ICD-10-CM | POA: Diagnosis not present

## 2023-08-16 DIAGNOSIS — H02831 Dermatochalasis of right upper eyelid: Secondary | ICD-10-CM | POA: Diagnosis not present

## 2023-08-16 DIAGNOSIS — H04123 Dry eye syndrome of bilateral lacrimal glands: Secondary | ICD-10-CM | POA: Diagnosis not present

## 2023-08-16 DIAGNOSIS — H02834 Dermatochalasis of left upper eyelid: Secondary | ICD-10-CM | POA: Diagnosis not present

## 2023-08-16 DIAGNOSIS — H43812 Vitreous degeneration, left eye: Secondary | ICD-10-CM | POA: Diagnosis not present

## 2023-08-16 DIAGNOSIS — H25813 Combined forms of age-related cataract, bilateral: Secondary | ICD-10-CM | POA: Diagnosis not present

## 2023-08-30 DIAGNOSIS — H25813 Combined forms of age-related cataract, bilateral: Secondary | ICD-10-CM | POA: Diagnosis not present

## 2023-09-15 DIAGNOSIS — I10 Essential (primary) hypertension: Secondary | ICD-10-CM | POA: Diagnosis not present

## 2023-09-15 DIAGNOSIS — H25811 Combined forms of age-related cataract, right eye: Secondary | ICD-10-CM | POA: Diagnosis not present

## 2023-09-16 DIAGNOSIS — H25812 Combined forms of age-related cataract, left eye: Secondary | ICD-10-CM | POA: Diagnosis not present

## 2023-09-22 DIAGNOSIS — Z961 Presence of intraocular lens: Secondary | ICD-10-CM | POA: Diagnosis not present
